# Patient Record
Sex: Male | Born: 1970 | Race: White | Hispanic: No | Smoking: Never smoker
Health system: Southern US, Community
[De-identification: ages and names within clinical notes are randomized; demographics above are authoritative.]

## PROBLEM LIST (undated history)

## (undated) DIAGNOSIS — I10 Essential (primary) hypertension: Secondary | ICD-10-CM

## (undated) HISTORY — PX: NO PAST SURGERIES: SHX2092

---

## 2019-11-18 ENCOUNTER — Encounter (HOSPITAL_BASED_OUTPATIENT_CLINIC_OR_DEPARTMENT_OTHER): Payer: Self-pay | Admitting: Emergency Medicine

## 2019-11-18 ENCOUNTER — Emergency Department (HOSPITAL_BASED_OUTPATIENT_CLINIC_OR_DEPARTMENT_OTHER): Payer: PRIVATE HEALTH INSURANCE

## 2019-11-18 ENCOUNTER — Inpatient Hospital Stay (HOSPITAL_BASED_OUTPATIENT_CLINIC_OR_DEPARTMENT_OTHER)
Admission: EM | Admit: 2019-11-18 | Discharge: 2019-11-22 | DRG: 291 | Disposition: A | Discharge status: Home / Self Care | Payer: PRIVATE HEALTH INSURANCE | Attending: Internal Medicine | Admitting: Internal Medicine

## 2019-11-18 ENCOUNTER — Other Ambulatory Visit: Payer: Self-pay

## 2019-11-18 DIAGNOSIS — Z79899 Other long term (current) drug therapy: Secondary | ICD-10-CM

## 2019-11-18 DIAGNOSIS — I878 Other specified disorders of veins: Secondary | ICD-10-CM | POA: Diagnosis not present

## 2019-11-18 DIAGNOSIS — I110 Hypertensive heart disease with heart failure: Secondary | ICD-10-CM | POA: Diagnosis not present

## 2019-11-18 DIAGNOSIS — R0602 Shortness of breath: Secondary | ICD-10-CM | POA: Diagnosis present

## 2019-11-18 DIAGNOSIS — Z7982 Long term (current) use of aspirin: Secondary | ICD-10-CM | POA: Diagnosis not present

## 2019-11-18 DIAGNOSIS — I161 Hypertensive emergency: Secondary | ICD-10-CM | POA: Diagnosis present

## 2019-11-18 DIAGNOSIS — Z6841 Body Mass Index (BMI) 40.0 and over, adult: Secondary | ICD-10-CM | POA: Diagnosis not present

## 2019-11-18 DIAGNOSIS — Z8616 Personal history of COVID-19: Secondary | ICD-10-CM

## 2019-11-18 DIAGNOSIS — I5031 Acute diastolic (congestive) heart failure: Secondary | ICD-10-CM | POA: Diagnosis present

## 2019-11-18 DIAGNOSIS — I509 Heart failure, unspecified: Secondary | ICD-10-CM

## 2019-11-18 DIAGNOSIS — I10 Essential (primary) hypertension: Secondary | ICD-10-CM

## 2019-11-18 DIAGNOSIS — I5033 Acute on chronic diastolic (congestive) heart failure: Secondary | ICD-10-CM | POA: Diagnosis present

## 2019-11-18 DIAGNOSIS — B356 Tinea cruris: Secondary | ICD-10-CM | POA: Diagnosis present

## 2019-11-18 HISTORY — DX: Essential (primary) hypertension: I10

## 2019-11-18 LAB — COMPREHENSIVE METABOLIC PANEL
ALT: 31 U/L (ref 0–44)
AST: 21 U/L (ref 15–41)
Albumin: 4.1 g/dL (ref 3.5–5.0)
Alkaline Phosphatase: 74 U/L (ref 38–126)
Anion gap: 11 (ref 5–15)
BUN: 19 mg/dL (ref 6–20)
CO2: 27 mmol/L (ref 22–32)
Calcium: 9.6 mg/dL (ref 8.9–10.3)
Chloride: 98 mmol/L (ref 98–111)
Creatinine, Ser: 1.06 mg/dL (ref 0.61–1.24)
GFR, Estimated: >60 mL/min (ref >60)
Glucose, Bld: 102 mg/dL — ABNORMAL HIGH (ref 70–99)
Potassium: 4.5 mmol/L (ref 3.5–5.1)
Sodium: 136 mmol/L (ref 135–145)
Total Bilirubin: 1 mg/dL (ref 0.3–1.2)
Total Protein: 7.9 g/dL (ref 6.5–8.1)

## 2019-11-18 LAB — CBC WITH DIFFERENTIAL/PLATELET
Abs Immature Granulocytes: 0.02 10*3/uL (ref 0.00–0.07)
Basophils Absolute: 0.1 10*3/uL (ref 0.0–0.1)
Basophils Relative: 1 %
Eosinophils Absolute: 0.3 10*3/uL (ref 0.0–0.5)
Eosinophils Relative: 3 %
HCT: 43.4 % (ref 39.0–52.0)
Hemoglobin: 14.3 g/dL (ref 13.0–17.0)
Immature Granulocytes: 0 %
Lymphocytes Relative: 20 %
Lymphs Abs: 1.6 10*3/uL (ref 0.7–4.0)
MCH: 28.5 pg (ref 26.0–34.0)
MCHC: 32.9 g/dL (ref 30.0–36.0)
MCV: 86.5 fL (ref 80.0–100.0)
Monocytes Absolute: 0.6 10*3/uL (ref 0.1–1.0)
Monocytes Relative: 8 %
Neutro Abs: 5.5 10*3/uL (ref 1.7–7.7)
Neutrophils Relative %: 68 %
Platelets: 252 10*3/uL (ref 150–400)
RBC: 5.02 MIL/uL (ref 4.22–5.81)
RDW: 14.4 % (ref 11.5–15.5)
WBC: 8.2 10*3/uL (ref 4.0–10.5)
nRBC: 0 % (ref 0.0–0.2)

## 2019-11-18 LAB — TROPONIN I (HIGH SENSITIVITY)
Troponin I (High Sensitivity): 34 ng/L — ABNORMAL HIGH (ref <18)
Troponin I (High Sensitivity): 31 ng/L — ABNORMAL HIGH (ref <18)

## 2019-11-18 LAB — RESPIRATORY PANEL BY RT PCR (FLU A&B, COVID)
Influenza A by PCR: NEGATIVE
Influenza B by PCR: NEGATIVE
SARS Coronavirus 2 by RT PCR: NEGATIVE

## 2019-11-18 LAB — BRAIN NATRIURETIC PEPTIDE: B Natriuretic Peptide: 88 pg/mL (ref 0.0–100.0)

## 2019-11-18 MED ORDER — FUROSEMIDE 10 MG/ML IJ SOLN
20.0000 mg | Freq: Once | INTRAMUSCULAR | Status: DC
  Filled 2019-11-18: qty 2

## 2019-11-18 MED ORDER — ASPIRIN 325 MG PO TABS
325.0000 mg | ORAL_TABLET | Freq: Every day | ORAL | Status: DC
  Administered 2019-11-19: 325 mg via ORAL
  Filled 2019-11-18: qty 1

## 2019-11-18 MED ORDER — HYDRALAZINE HCL 20 MG/ML IJ SOLN
10.0000 mg | Freq: Four times a day (QID) | INTRAMUSCULAR | Status: DC | PRN
  Administered 2019-11-18 – 2019-11-21 (×4): 10 mg via INTRAVENOUS
  Filled 2019-11-18 (×5): qty 1

## 2019-11-18 MED ORDER — FUROSEMIDE 10 MG/ML IJ SOLN
20.0000 mg | Freq: Once | INTRAMUSCULAR | Status: AC
  Administered 2019-11-18: 20 mg via INTRAVENOUS

## 2019-11-18 MED ORDER — ASPIRIN 81 MG PO CHEW
324.0000 mg | CHEWABLE_TABLET | Freq: Once | ORAL | Status: DC
  Filled 2019-11-18: qty 4

## 2019-11-18 MED ORDER — ACETAMINOPHEN 325 MG PO TABS
650.0000 mg | ORAL_TABLET | ORAL | Status: DC | PRN
  Administered 2019-11-19 – 2019-11-21 (×4): 650 mg via ORAL
  Filled 2019-11-18 (×4): qty 2

## 2019-11-18 MED ORDER — FUROSEMIDE 10 MG/ML IJ SOLN
20.0000 mg | Freq: Two times a day (BID) | INTRAMUSCULAR | Status: DC
  Administered 2019-11-19 – 2019-11-20 (×3): 20 mg via INTRAVENOUS
  Filled 2019-11-18 (×3): qty 2

## 2019-11-18 MED ORDER — ONDANSETRON HCL 4 MG/2ML IJ SOLN: 4.0000 mg | Freq: Four times a day (QID) | INTRAMUSCULAR | Status: DC | PRN

## 2019-11-18 MED ORDER — NITROGLYCERIN 0.4 MG SL SUBL
0.4000 mg | SUBLINGUAL_TABLET | SUBLINGUAL | Status: DC | PRN
  Administered 2019-11-18 (×2): 0.4 mg via SUBLINGUAL
  Filled 2019-11-18: qty 1

## 2019-11-18 NOTE — ED Triage Notes (Signed)
Reports left upper chest pain after playing around with a friend yesterday.  Reports he lifted her off the ground.  Afterwards had difficulty catching his breath.  Also noticed swelling in lower ext for the last six weeks.  Reports they started oozing a clear fluid a week ago.  Small sores noted.  Also reports he had covid at the end of august and self medicated with ivermectin.

## 2019-11-18 NOTE — ED Provider Notes (Signed)
[facility] EMERGENCY DEPARTMENT Provider Note   CSN: [id number] Arrival date & time: 11/18/19  1924     History Chief Complaint  Patient presents with  . Chest Pain  . Leg Swelling    Shawn Sanders is a 49 y.o. male.  HPI 49 year old male presents with multiple complaints, primarily shortness of breath and leg swelling.  He states the leg swelling has been on and off for a while but constant for about 2 weeks.  He also feels like his abdomen is swollen.  He has been having shortness of breath since he had Covid in August.  Most notices shortness of breath with minimal exertion.  However last night when he picked up a friend he noticed much more severe shortness of breath that took about an hour to go away.  He has been having some chest tightness/burning ever since last night.  He has not seen a doctor since 2015.  To his knowledge he does not have any medical problems. Has been waking up with orthopnea over the past several weeks.    Past Medical History:  Diagnosis Date  . Hypertension     Patient Active Problem List   Diagnosis Date Noted  . Acute congestive heart failure (HCC) 11/18/2019         No family history on file.  Social History   Tobacco Use  . Smoking status: Never Smoker  . Smokeless tobacco: Never Used  Substance Use Topics  . Alcohol use: Never  . Drug use: Never    Home Medications Prior to Admission medications   Not on File    Allergies    Patient has no allergy information on record.  Review of Systems   Review of Systems  Respiratory: Positive for shortness of breath.   Cardiovascular: Positive for chest pain and leg swelling.  Gastrointestinal: Positive for abdominal distention.  All other systems reviewed and are negative.   Physical Exam Updated Vital Signs BP (!) 208/135   Pulse 75   Temp 97.8 F (36.6 C) (Oral)   Resp 18   Ht 6\' 1"  (1.854 m)   Wt (!) 164.8 kg   SpO2 96%   BMI 47.93 kg/m   Physical  Exam Vitals and nursing note reviewed.  Constitutional:      Appearance: He is well-developed. He is obese.  HENT:     Head: Normocephalic and atraumatic.     Right Ear: External ear normal.     Left Ear: External ear normal.     Nose: Nose normal.  Eyes:     General:        Right eye: No discharge.        Left eye: No discharge.  Cardiovascular:     Rate and Rhythm: Normal rate and regular rhythm.     Heart sounds: Normal heart sounds.  Pulmonary:     Effort: Pulmonary effort is normal.     Breath sounds: Normal breath sounds. No decreased breath sounds, wheezing, rhonchi or rales.  Abdominal:     Palpations: Abdomen is soft.     Tenderness: There is no abdominal tenderness.  Musculoskeletal:     Cervical back: Neck supple.     Right lower leg: Edema present.     Left lower leg: Edema present.     Comments: Pitting edema bilaterally up to knees  Skin:    General: Skin is warm and dry.  Neurological:     Mental Status: He is alert.  Psychiatric:  Mood and Affect: Mood is not anxious.     ED Results / Procedures / Treatments   Labs (all labs ordered are listed, but only abnormal results are displayed) Labs Reviewed  COMPREHENSIVE METABOLIC PANEL - Abnormal; Notable for the following components:      Result Value   Glucose, Bld 102 (*)    All other components within normal limits  TROPONIN I (HIGH SENSITIVITY) - Abnormal; Notable for the following components:   Troponin I (High Sensitivity) 34 (*)    All other components within normal limits  TROPONIN I (HIGH SENSITIVITY) - Abnormal; Notable for the following components:   Troponin I (High Sensitivity) 31 (*)    All other components within normal limits  RESPIRATORY PANEL BY RT PCR (FLU A&B, COVID)  BRAIN NATRIURETIC PEPTIDE  CBC WITH DIFFERENTIAL/PLATELET  CBC WITH DIFFERENTIAL/PLATELET    EKG EKG Interpretation  Date/Time:  Sunday November 18 2019 19:29:00 EST Ventricular Rate:  91 PR  Interval:  138 QRS Duration: 98 QT Interval:  372 QTC Calculation: 457 R Axis:   31 Text Interpretation: Normal sinus rhythm no acute ST/T changes Confirmed by Pricilla Loveless [phone]) on 11/18/2019 7:50:15 PM   Radiology DG Chest 2 View  Result Date: 11/18/2019 CLINICAL DATA:  Chest pain EXAM: CHEST - 2 VIEW COMPARISON:  None. FINDINGS: The cardiomediastinal silhouette is enlarged. Both lungs are clear. The visualized skeletal structures are unremarkable. IMPRESSION: No active cardiopulmonary disease. Electronically Signed   By: Katherine Mantle M.D.   On: 11/18/2019 20:52    Procedures Procedures (including critical care time)  Medications Ordered in ED Medications  aspirin chewable tablet 324 mg (0 mg Oral Hold 11/18/19 2009)  nitroGLYCERIN (NITROSTAT) SL tablet 0.4 mg (0.4 mg Sublingual Given 11/18/19 2014)  hydrALAZINE (APRESOLINE) injection 10 mg (10 mg Intravenous Given 11/18/19 2235)  ondansetron (ZOFRAN) injection 4 mg (has no administration in time range)  acetaminophen (TYLENOL) tablet 650 mg (has no administration in time range)  furosemide (LASIX) injection 20 mg (has no administration in time range)  aspirin tablet 325 mg (has no administration in time range)  furosemide (LASIX) injection 20 mg (20 mg Intravenous Given 11/18/19 2250)    ED Course  I have reviewed the triage vital signs and the nursing notes.  Pertinent labs & imaging results that were available during my care of the patient were reviewed by me and considered in my medical decision making (see chart for details).    MDM Rules/Calculators/A&P                          My suspicion is the patient is having low level degree of CHF, likely from uncontrolled hypertension.  His blood pressure is much better with nitroglycerin and he symptomatically feels a lot better and says he has no further shortness of breath.  Troponin is elevated but then flat, indicating this probably is not ACS.  My suspicion for PE is  pretty low given the weight gain, edema, and orthopnea.  I think you will need better blood pressure control, echo, and diuresis.  Given no PCP this would be best done in the hospital.  Discussed with Dr. Leafy Half for admission. Final Clinical Impression(s) / ED Diagnoses Final diagnoses:  Acute congestive heart failure, unspecified heart failure type [facility])    Rx / DC Orders ED Discharge Orders    None       Pricilla Loveless, MD 11/18/19 2311

## 2019-11-18 NOTE — ED Notes (Signed)
Returned from XR 

## 2019-11-19 ENCOUNTER — Emergency Department (HOSPITAL_BASED_OUTPATIENT_CLINIC_OR_DEPARTMENT_OTHER): Payer: PRIVATE HEALTH INSURANCE

## 2019-11-19 ENCOUNTER — Encounter (HOSPITAL_COMMUNITY): Payer: Self-pay | Admitting: Internal Medicine

## 2019-11-19 DIAGNOSIS — B356 Tinea cruris: Secondary | ICD-10-CM

## 2019-11-19 DIAGNOSIS — I10 Essential (primary) hypertension: Secondary | ICD-10-CM

## 2019-11-19 DIAGNOSIS — I509 Heart failure, unspecified: Secondary | ICD-10-CM | POA: Diagnosis not present

## 2019-11-19 HISTORY — DX: Tinea cruris: B35.6

## 2019-11-19 HISTORY — DX: Essential (primary) hypertension: I10

## 2019-11-19 LAB — TROPONIN I (HIGH SENSITIVITY): Troponin I (High Sensitivity): 33 ng/L — ABNORMAL HIGH (ref <18)

## 2019-11-19 MED ORDER — FLUCONAZOLE 150 MG PO TABS
150.0000 mg | ORAL_TABLET | ORAL | Status: DC
  Administered 2019-11-19: 150 mg via ORAL
  Filled 2019-11-19: qty 1

## 2019-11-19 MED ORDER — ASPIRIN 325 MG PO TABS
325.0000 mg | ORAL_TABLET | Freq: Every day | ORAL | Status: DC
  Administered 2019-11-20 – 2019-11-22 (×3): 325 mg via ORAL
  Filled 2019-11-19 (×3): qty 1

## 2019-11-19 MED ORDER — ENOXAPARIN SODIUM 40 MG/0.4ML ~~LOC~~ SOLN
40.0000 mg | SUBCUTANEOUS | Status: DC
  Administered 2019-11-19: 40 mg via SUBCUTANEOUS
  Filled 2019-11-19: qty 0.4

## 2019-11-19 MED ORDER — HYDROCODONE-ACETAMINOPHEN 5-325 MG PO TABS
1.0000 | ORAL_TABLET | Freq: Once | ORAL | Status: AC
  Administered 2019-11-19: 1 via ORAL
  Filled 2019-11-19: qty 1

## 2019-11-19 MED ORDER — SODIUM CHLORIDE 0.9% FLUSH
3.0000 mL | Freq: Two times a day (BID) | INTRAVENOUS | Status: DC
  Administered 2019-11-19 – 2019-11-22 (×6): 3 mL via INTRAVENOUS

## 2019-11-19 NOTE — Plan of Care (Signed)
Pt verbalizes understanding of lasix and it's effect on fluid volume overload. Pt verbalizing frustration with headache associated with antihypertensives. BP improving.

## 2019-11-19 NOTE — ED Provider Notes (Signed)
6:38 AM  Patient holding for admission for bed for CHF and hypertension.  Arrived in the ED with blood pressures greater than 200 systolic.  Reports that he has mostly improved but has had persistent headache throughout the evening.  Headache is refractory to Tylenol and Norco.  States that the headache is weird and somewhat positional.  He remains persistently hypertensive.  Recent blood pressure 175/117.  Will obtain CT to rule out bleed given persistent hypertension.   Shon Baton, MD 11/19/19 [phone]

## 2019-11-19 NOTE — ED Notes (Signed)
Pt to CT

## 2019-11-19 NOTE — ED Notes (Signed)
Returned from CT.

## 2019-11-19 NOTE — ED Notes (Addendum)
Pt c/o "significant headache" still despite Tylenol, but states overall he still feels "much better". Denies any other sx.  Verbal orders given for Norco.

## 2019-11-19 NOTE — ED Notes (Signed)
Pt continues to c/o HA despite medications. He continues to deny any other sx. EDP notified. Orders received.

## 2019-11-19 NOTE — H&P (Signed)
History and Physical   Shawn Sanders AST:[id number] DOB: 16-Aug-1970 DOA: 11/18/2019  PCP: Patient, No Pcp Per   Patient coming from: Home via [facility]  Chief Complaint: Worsening edema and orthopnea  HPI: Shawn Sanders is a 49 y.o. male with medical history significant of hypertension who presents with gradual worsening edema with weight gain, which has begun to worsen more rapidly in the past week with the addition of orthopnea.  Patient has a history of high blood pressure and had tried some medicines about 15 years ago but he felt poorly after starting lisinopril possibly due to dropping his blood pressure too much, and had some numbness with Benicar.  He has not taken blood pressure medicine in the last 15 years or so.  He reports that he had Covid in August (was not tested, but his son was positive for Covid and he had symptoms at that time) and since that illness he has had continued dyspnea on exertion and has noticed progressive weight gain and worsening shortness of breath with need of sleeping in a recliner.  As above he has had gradual worsening of his edema as well and reports orthopnea.  He had an episode where he felt very significantly short of breath after picking up his girlfriend on Saturday night which would typically not make him short of breath.  He states that he feels much better after receiving blood pressure medication and a dose of Lasix in the ED.  He denies alcohol or tobacco use.    ED Course: Blood pressure in the ED was initially 170s to 200s systolic.  Vital signs otherwise stable.  Lab work showed normal CMP and CBC.  BNP was 88 (in the setting of obesity).  Troponins were flat at 34, 31, 33.  Respiratory panel was negative.  Chest x-ray showed no acute disease but did note in enlarged cardiomediastinal silhouette.  He did have a CT head done due to persistent headache which was normal.  EKG showed normal sinus rhythm.  Patient was treated with nitroglycerin,  hydralazine, Lasix with significant improvement in his symptoms.  Review of Systems: As per HPI otherwise all other systems reviewed and are negative.  Past Medical History:  Diagnosis Date  . Hypertension     Past Surgical History:  Procedure Laterality Date  . NO PAST SURGERIES      Social History  reports that he has never smoked. He has never used smokeless tobacco. He reports that he does not drink alcohol and does not use drugs.  Allergies  Allergen Reactions  . Penicillins Anaphylaxis  . Olive Oil     Blisters   . Other     Lobster: Swelling   . Raspberry     Swelling     Family History  Problem Relation Age of Onset  . Breast cancer Mother   . Hypertension Father   Reviewed on admission   Prior to Admission medications   Medication Sig Start Date End Date Taking? Authorizing Provider  acetaminophen (TYLENOL) 500 MG tablet Take 500 mg by mouth every 6 (six) hours as needed for mild pain.   Yes [provider]  aspirin 325 MG tablet Take 325 mg by mouth daily.   Yes [provider]    Physical Exam: Vitals:   11/19/19 0847 11/19/19 0950 11/19/19 1619 11/19/19 2034  BP:  (!) 205/116 (!) 171/141 (!) 204/121  Pulse:  80 71 76  Resp:  18 17 16   Temp: 98.5 F (  36.9 C) 97.8 F (36.6 C)  97.7 F (36.5 C)  TempSrc: Oral Oral  Oral  SpO2:  99% 97% 97%  Weight:  (!) 162.5 kg    Height:  6\' 1"  (1.854 m)     Physical Exam Constitutional:      General: He is not in acute distress.    Appearance: Normal appearance. He is obese.  HENT:     Head: Normocephalic and atraumatic.     Mouth/Throat:     Mouth: Mucous membranes are moist.     Pharynx: Oropharynx is clear.  Eyes:     Extraocular Movements: Extraocular movements intact.     Pupils: Pupils are equal, round, and reactive to light.  Cardiovascular:     Rate and Rhythm: Normal rate and regular rhythm.     Pulses: Normal pulses.     Heart sounds: Normal heart sounds.  Pulmonary:      Effort: Pulmonary effort is normal. No respiratory distress.     Breath sounds: Normal breath sounds.  Abdominal:     General: Bowel sounds are normal. There is no distension.     Palpations: Abdomen is soft.     Tenderness: There is no abdominal tenderness.  Musculoskeletal:        General: No swelling or deformity.     Right lower leg: Edema present.     Left lower leg: Edema present.  Skin:    General: Skin is warm and dry.     Comments: Venous stasis changes bilateral LEs  Neurological:     General: No focal deficit present.     Mental Status: Mental status is at baseline.    Labs on Admission: I have personally reviewed following labs and imaging studies  CBC: Recent Labs  Lab 11/18/19 2117  WBC 8.2  NEUTROABS 5.5  HGB 14.3  HCT 43.4  MCV 86.5  PLT 252    Basic Metabolic Panel: Recent Labs  Lab 11/18/19 2117  NA 136  K 4.5  CL 98  CO2 27  GLUCOSE 102*  BUN 19  CREATININE 1.06  CALCIUM 9.6    GFR: Estimated Creatinine Clearance: 133.1 mL/min (by C-G formula based on SCr of 1.06 mg/dL).  Liver Function Tests: Recent Labs  Lab 11/18/19 2117  AST 21  ALT 31  ALKPHOS 74  BILITOT 1.0  PROT 7.9  ALBUMIN 4.1    Urine analysis: No results found for: COLORURINE, APPEARANCEUR, LABSPEC, PHURINE, GLUCOSEU, HGBUR, BILIRUBINUR, KETONESUR, PROTEINUR, UROBILINOGEN, NITRITE, LEUKOCYTESUR  Radiological Exams on Admission: DG Chest 2 View  Result Date: 11/18/2019 CLINICAL DATA:  Chest pain EXAM: CHEST - 2 VIEW COMPARISON:  None. FINDINGS: The cardiomediastinal silhouette is enlarged. Both lungs are clear. The visualized skeletal structures are unremarkable. IMPRESSION: No active cardiopulmonary disease. Electronically Signed   By: 13/07/2019 M.D.   On: 11/18/2019 20:52   CT Head Wo Contrast  Result Date: 11/19/2019 CLINICAL DATA:  Hypertension and persistent headache. EXAM: CT HEAD WITHOUT CONTRAST TECHNIQUE: Contiguous axial images were obtained from  the base of the skull through the vertex without intravenous contrast. COMPARISON:  None. FINDINGS: Brain: The ventricles are normal in size and configuration. No extra-axial fluid collections are identified. The gray-white differentiation is maintained. No CT findings for acute hemispheric infarction or intracranial hemorrhage. No mass lesions. The brainstem and cerebellum are normal. Vascular: No hyperdense vessels or obvious aneurysm. Skull: No acute skull fracture. No bone lesion. Sinuses/Orbits: The paranasal sinuses and mastoid air cells are clear. The globes  are intact. Other: No scalp lesions, laceration or hematoma. IMPRESSION: Normal head CT. Electronically Signed   By: Rudie Meyer M.D.   On: 11/19/2019 06:54    EKG: Independently reviewed.  Normal sinus rhythm  Assessment/Plan Principal Problem:   Acute congestive heart failure (HCC) Active Problems:   HTN (hypertension)  Hypertension Acute heart failure versus hypertensive emergency > Patient with a known history of high blood pressure not on medication > Present with worsening orthopnea and edema in the setting of blood pressure in the 170s to 200s systolic > Features of acute heart failure due to longstanding hypertension versus hypertensive emergency > BNP was 88 but this in the setting of BMI of 47. > Responded to 20 mg of IV Lasix and antihypertensives with significant improvement in symptoms > Lungs are clear does have residual edema, but has venous stasis changes which may explain some of this - Echocardiogram - Continue IV Lasix - Daily weights, strict I/O - We will restart on lisinopril - Continue as needed hydralazine - Follow BMP  Tinea Curis > Increased itching for several months, previously responded to Diflucan - Diflucan p.o. 150 mg weekly for 4 weeks  Suspect Venous Stasis   DVT prophylaxis: Lovenox  Code Status:   Full  Family Communication:  Significant other updated at bedside  Disposition Plan:    Patient is from:  Home  Anticipated DC to:  Home  Anticipated DC date:  11/9 pending work-up  Anticipated DC barriers: None  Consults called:  None  Admission status:  Observation, telemetry  Severity of Illness: The appropriate patient status for this patient is OBSERVATION. Observation status is judged to be reasonable and necessary in order to provide the required intensity of service to ensure the patient's safety. The patient's presenting symptoms, physical exam findings, and initial radiographic and laboratory data in the context of their medical condition is felt to place them at decreased risk for further clinical deterioration. Furthermore, it is anticipated that the patient will be medically stable for discharge from the hospital within 2 midnights of admission. The following factors support the patient status of observation.   " The patient's presenting symptoms include edema, DOE, orthopnea. " The physical exam findings include lower extremity edema. " The initial radiographic and laboratory data are stable.  Synetta Fail MD [facility]  How to contact the [facility] Attending or Consulting provider 7A - 7P or covering provider during after hours 7P -7A, for this patient?   1. Check the care team in [facility] and look for a) attending/consulting [facility] provider listed and b) the [facility] team listed 2. Log into [email or web address] and use [facility]'s universal password to access. If you do not have the password, please contact the hospital operator. 3. Locate the [facility] provider you are looking for under [facility] and page to a number that you can be directly reached. 4. If you still have difficulty reaching the provider, please page the [facility] (Director on Call) for the Hospitalists listed on amion for assistance.  11/19/2019, 8:50 PM

## 2019-11-20 ENCOUNTER — Observation Stay (HOSPITAL_COMMUNITY): Payer: PRIVATE HEALTH INSURANCE

## 2019-11-20 DIAGNOSIS — I5031 Acute diastolic (congestive) heart failure: Secondary | ICD-10-CM | POA: Diagnosis not present

## 2019-11-20 DIAGNOSIS — I509 Heart failure, unspecified: Secondary | ICD-10-CM

## 2019-11-20 DIAGNOSIS — Z6841 Body Mass Index (BMI) 40.0 and over, adult: Secondary | ICD-10-CM | POA: Diagnosis not present

## 2019-11-20 DIAGNOSIS — I110 Hypertensive heart disease with heart failure: Secondary | ICD-10-CM | POA: Diagnosis present

## 2019-11-20 DIAGNOSIS — I10 Essential (primary) hypertension: Secondary | ICD-10-CM | POA: Diagnosis not present

## 2019-11-20 DIAGNOSIS — Z8616 Personal history of COVID-19: Secondary | ICD-10-CM | POA: Diagnosis not present

## 2019-11-20 DIAGNOSIS — I159 Secondary hypertension, unspecified: Secondary | ICD-10-CM

## 2019-11-20 DIAGNOSIS — I161 Hypertensive emergency: Secondary | ICD-10-CM

## 2019-11-20 DIAGNOSIS — Z79899 Other long term (current) drug therapy: Secondary | ICD-10-CM | POA: Diagnosis not present

## 2019-11-20 DIAGNOSIS — Z7982 Long term (current) use of aspirin: Secondary | ICD-10-CM | POA: Diagnosis not present

## 2019-11-20 DIAGNOSIS — B356 Tinea cruris: Secondary | ICD-10-CM

## 2019-11-20 DIAGNOSIS — I878 Other specified disorders of veins: Secondary | ICD-10-CM | POA: Diagnosis present

## 2019-11-20 DIAGNOSIS — R0602 Shortness of breath: Secondary | ICD-10-CM | POA: Diagnosis present

## 2019-11-20 DIAGNOSIS — I5033 Acute on chronic diastolic (congestive) heart failure: Secondary | ICD-10-CM | POA: Diagnosis present

## 2019-11-20 HISTORY — DX: Hypertensive emergency: I16.1

## 2019-11-20 HISTORY — DX: Heart failure, unspecified: I50.9

## 2019-11-20 LAB — ECHOCARDIOGRAM COMPLETE
AR max vel: 2.34 cm2
AV Area VTI: 2.48 cm2
AV Area mean vel: 2.07 cm2
AV Mean grad: 10 mmHg
AV Peak grad: 18 mmHg
Ao pk vel: 2.12 m/s
Area-P 1/2: 3.12 cm2
Calc EF: 66.4 %
Height: 73 in
S' Lateral: 2.9 cm
Single Plane A2C EF: 70.9 %
Single Plane A4C EF: 57 %
Weight: 5691.2 oz

## 2019-11-20 LAB — CBC
HCT: 46.2 % (ref 39.0–52.0)
Hemoglobin: 15.1 g/dL (ref 13.0–17.0)
MCH: 28.3 pg (ref 26.0–34.0)
MCHC: 32.7 g/dL (ref 30.0–36.0)
MCV: 86.7 fL (ref 80.0–100.0)
Platelets: 259 10*3/uL (ref 150–400)
RBC: 5.33 MIL/uL (ref 4.22–5.81)
RDW: 14.5 % (ref 11.5–15.5)
WBC: 7.7 10*3/uL (ref 4.0–10.5)
nRBC: 0 % (ref 0.0–0.2)

## 2019-11-20 LAB — BASIC METABOLIC PANEL
Anion gap: 10 (ref 5–15)
BUN: 16 mg/dL (ref 6–20)
CO2: 28 mmol/L (ref 22–32)
Calcium: 9.8 mg/dL (ref 8.9–10.3)
Chloride: 98 mmol/L (ref 98–111)
Creatinine, Ser: 1.18 mg/dL (ref 0.61–1.24)
GFR, Estimated: >60 mL/min (ref >60)
Glucose, Bld: 128 mg/dL — ABNORMAL HIGH (ref 70–99)
Potassium: 3.6 mmol/L (ref 3.5–5.1)
Sodium: 136 mmol/L (ref 135–145)

## 2019-11-20 LAB — HIV ANTIBODY (ROUTINE TESTING W REFLEX): HIV Screen 4th Generation wRfx: NONREACTIVE

## 2019-11-20 MED ORDER — LISINOPRIL 10 MG PO TABS
10.0000 mg | ORAL_TABLET | Freq: Every day | ORAL | Status: DC
  Filled 2019-11-20: qty 1

## 2019-11-20 MED ORDER — AMLODIPINE BESYLATE 10 MG PO TABS
10.0000 mg | ORAL_TABLET | Freq: Every day | ORAL | Status: DC
  Administered 2019-11-20 – 2019-11-22 (×3): 10 mg via ORAL
  Filled 2019-11-20 (×3): qty 1

## 2019-11-20 MED ORDER — FUROSEMIDE 10 MG/ML IJ SOLN
20.0000 mg | Freq: Four times a day (QID) | INTRAMUSCULAR | Status: AC
  Administered 2019-11-20 – 2019-11-21 (×4): 20 mg via INTRAVENOUS
  Filled 2019-11-20 (×4): qty 2

## 2019-11-20 MED ORDER — FUROSEMIDE 10 MG/ML IJ SOLN
20.0000 mg | Freq: Four times a day (QID) | INTRAVENOUS | Status: DC
  Filled 2019-11-20 (×2): qty 2

## 2019-11-20 MED ORDER — ENOXAPARIN SODIUM 80 MG/0.8ML ~~LOC~~ SOLN
80.0000 mg | SUBCUTANEOUS | Status: DC
  Administered 2019-11-20 – 2019-11-21 (×2): 80 mg via SUBCUTANEOUS
  Filled 2019-11-20 (×2): qty 0.8

## 2019-11-20 MED ORDER — PERFLUTREN LIPID MICROSPHERE
1.0000 mL | INTRAVENOUS | Status: AC | PRN
  Administered 2019-11-20: 2 mL via INTRAVENOUS
  Filled 2019-11-20: qty 10

## 2019-11-20 NOTE — Progress Notes (Signed)
PROGRESS NOTE    Shawn Sanders  XBL:[id number] DOB: Apr 05, 1970 DOA: 11/18/2019 PCP: Patient, No Pcp Per   Brief Narrative:  49 year old male with history of obesity who has not seen a medical provider and a number of years who presents to [facility] emergency department with several day history of progressively worsening peripheral edema, pillow orthopnea paroxysmal nocturnal dyspnea.  Patient also complaining of vague atypical chest tightness.  Patient presenting with hypertensive urgency and blood pressure of 233/134.  Emergency department provider felt the patient was suffering from acute congestive heart failure despite normal BNP and therefore provided patient with 20 mg of IV Lasix.  Blood pressure improving status post Lasix administration.  ER provider requesting admission to [facility] bed for titration of antihypertensives, IV diuretics, echocardiogram and evaluation of atypical chest discomfort.  Patient placed in observation at [facility] telemetry bed.    Assessment & Plan:   Principal Problem:   Acute congestive heart failure (HCC) Active Problems:   HTN (hypertension)   Tinea cruris  Hypertensive emergency, POA Rule out new onset heart failure -Patient presents with diffuse edema, atypical chest pain with systolic blood pressure greater than 210 -Patient reports intolerances to lisinopril, in the form of dizziness -Initiate patient on amlodipine, continue Lasix at increased dose x4 doses to improve volume overload -Echo pending, rule out newly diagnosed heart failure, lengthy discussion at bedside about need for medication and lifestyle compliance as well as for close follow-up in the outpatient setting as he does not currently have a PCP or cardiologist  Tinea Curis -Previously diagnosed in the outpatient setting was given as needed Diflucan at some point in the recent past with moderate improvement in symptoms -Continue Diflucan p.o. 150 mg weekly for 4  weeks  DVT prophylaxis: Lovenox Code Status: Full Family Communication: None present  Status is: Inpatient  Dispo: The patient is from: Home              Anticipated d/c is to: Home              Anticipated d/c date is: 24 to 48 hours pending clinical course              Patient currently not medically stable for discharge  Consultants:   None  Procedures:   None  Antimicrobials:  Diflucan  Subjective: No acute issues or events overnight, quite anxious about his elevated blood pressure given his family history of heart failure and is concerned about his own increased mortality.  Denies nausea, vomiting, diarrhea, constipation, headache, fevers, chills, chest pain, shortness of breath.  He does report ongoing lower extremity edema but appears to be resolving from previous  Objective: Vitals:   11/20/19 0000 11/20/19 0436 11/20/19 1009 11/20/19 1222  BP: (!) 163/92 (!) 174/108 (!) 190/108 (!) 186/102  Pulse:  76  80  Resp:  16  18  Temp:  98.1 F (36.7 C)  98.6 F (37 C)  TempSrc:  Oral  Oral  SpO2:    93%  Weight:  (!) 161.3 kg    Height:        Intake/Output Summary (Last 24 hours) at 11/20/2019 1355 Last data filed at 11/20/2019 0848 Gross per 24 hour  Intake 600 ml  Output 1750 ml  Net -1150 ml   Filed Weights   11/18/19 1940 11/19/19 0950 11/20/19 0436  Weight: (!) 164.8 kg (!) 162.5 kg (!) 161.3 kg    Examination:  General:  Pleasantly resting in bed, No  acute distress. HEENT:  Normocephalic atraumatic.  Sclerae nonicteric, noninjected.  Extraocular movements intact bilaterally. Neck:  Without mass or deformity.  Trachea is midline. Lungs:  Clear to auscultate bilaterally without rhonchi, wheeze, or rales. Heart:  Regular rate and rhythm.  Without murmurs, rubs, or gallops. Abdomen:  Soft, nontender, nondistended.  Without guarding or rebound. Extremities: Without cyanosis, clubbing, 2+ BLE edema, or obvious deformity. Vascular:  Dorsalis pedis and  posterior tibial pulses palpable bilaterally. Skin:  Warm and dry, no erythema, no ulcerations.   Data Reviewed: I have personally reviewed following labs and imaging studies  CBC: Recent Labs  Lab 11/18/19 2117 11/20/19 0534  WBC 8.2 7.7  NEUTROABS 5.5  --   HGB 14.3 15.1  HCT 43.4 46.2  MCV 86.5 86.7  PLT 252 259   Basic Metabolic Panel: Recent Labs  Lab 11/18/19 2117 11/20/19 0534  NA 136 136  K 4.5 3.6  CL 98 98  CO2 27 28  GLUCOSE 102* 128*  BUN 19 16  CREATININE 1.06 1.18  CALCIUM 9.6 9.8   GFR: Estimated Creatinine Clearance: 119.2 mL/min (by C-G formula based on SCr of 1.18 mg/dL). Liver Function Tests: Recent Labs  Lab 11/18/19 2117  AST 21  ALT 31  ALKPHOS 74  BILITOT 1.0  PROT 7.9  ALBUMIN 4.1   No results for input(s): LIPASE, AMYLASE in the last 168 hours. No results for input(s): AMMONIA in the last 168 hours. Coagulation Profile: No results for input(s): INR, PROTIME in the last 168 hours. Cardiac Enzymes: No results for input(s): CKTOTAL, CKMB, CKMBINDEX, TROPONINI in the last 168 hours. BNP (last 3 results) No results for input(s): PROBNP in the last 8760 hours. HbA1C: No results for input(s): HGBA1C in the last 72 hours. CBG: No results for input(s): GLUCAP in the last 168 hours. Lipid Profile: No results for input(s): CHOL, HDL, LDLCALC, TRIG, CHOLHDL, LDLDIRECT in the last 72 hours. Thyroid Function Tests: No results for input(s): TSH, T4TOTAL, FREET4, T3FREE, THYROIDAB in the last 72 hours. Anemia Panel: No results for input(s): VITAMINB12, FOLATE, FERRITIN, TIBC, IRON, RETICCTPCT in the last 72 hours. Sepsis Labs: No results for input(s): PROCALCITON, LATICACIDVEN in the last 168 hours.  Recent Results (from the past 240 hour(s))  Respiratory Panel by RT PCR (Flu A&B, Covid) - Nasopharyngeal Swab     Status: None   Collection Time: 11/18/19 10:12 PM   Specimen: Nasopharyngeal Swab  Result Value Ref Range Status   SARS  Coronavirus 2 by RT PCR NEGATIVE NEGATIVE Final    Comment: (NOTE) SARS-CoV-2 target nucleic acids are NOT DETECTED.  The SARS-CoV-2 RNA is generally detectable in upper respiratoy specimens during the acute phase of infection. The lowest concentration of SARS-CoV-2 viral copies this assay can detect is 131 copies/mL. A negative result does not preclude SARS-Cov-2 infection and should not be used as the sole basis for treatment or other patient management decisions. A negative result may occur with  improper specimen collection/handling, submission of specimen other than nasopharyngeal swab, presence of viral mutation(s) within the areas targeted by this assay, and inadequate number of viral copies (<131 copies/mL). A negative result must be combined with clinical observations, patient history, and epidemiological information. The expected result is Negative.  Fact Sheet for Patients:  https://www.moore.com/https://www.fda.gov/media/142436/download  Fact Sheet for Healthcare Providers:  https://www.young.biz/https://www.fda.gov/media/142435/download  This test is no t yet approved or cleared by the [place] FDA and  has been authorized for detection and/or diagnosis of SARS-CoV-2 by FDA under an  Emergency Use Authorization (EUA). This EUA will remain  in effect (meaning this test can be used) for the duration of the COVID-19 declaration under Section 564(b)(1) of the Act, 21 U.S.C. section 360bbb-3(b)(1), unless the authorization is terminated or revoked sooner.     Influenza A by PCR NEGATIVE NEGATIVE Final   Influenza B by PCR NEGATIVE NEGATIVE Final    Comment: (NOTE) The Xpert Xpress SARS-CoV-2/FLU/RSV assay is intended as an aid in  the diagnosis of influenza from Nasopharyngeal swab specimens and  should not be used as a sole basis for treatment. Nasal washings and  aspirates are unacceptable for Xpert Xpress SARS-CoV-2/FLU/RSV  testing.  Fact Sheet for  Patients: https://www.moore.com/  Fact Sheet for Healthcare Providers: https://www.young.biz/  This test is not yet approved or cleared by the [place] FDA and  has been authorized for detection and/or diagnosis of SARS-CoV-2 by  FDA under an Emergency Use Authorization (EUA). This EUA will remain  in effect (meaning this test can be used) for the duration of the  Covid-19 declaration under Section 564(b)(1) of the Act, 21  U.S.C. section 360bbb-3(b)(1), unless the authorization is  terminated or revoked. Performed at [facility], [address] [place], [place] [address]          Radiology Studies: DG Chest 2 View  Result Date: 11/18/2019 CLINICAL DATA:  Chest pain EXAM: CHEST - 2 VIEW COMPARISON:  None. FINDINGS: The cardiomediastinal silhouette is enlarged. Both lungs are clear. The visualized skeletal structures are unremarkable. IMPRESSION: No active cardiopulmonary disease. Electronically Signed   By: Katherine Mantle M.D.   On: 11/18/2019 20:52   CT Head Wo Contrast  Result Date: 11/19/2019 CLINICAL DATA:  Hypertension and persistent headache. EXAM: CT HEAD WITHOUT CONTRAST TECHNIQUE: Contiguous axial images were obtained from the base of the skull through the vertex without intravenous contrast. COMPARISON:  None. FINDINGS: Brain: The ventricles are normal in size and configuration. No extra-axial fluid collections are identified. The gray-white differentiation is maintained. No CT findings for acute hemispheric infarction or intracranial hemorrhage. No mass lesions. The brainstem and cerebellum are normal. Vascular: No hyperdense vessels or obvious aneurysm. Skull: No acute skull fracture. No bone lesion. Sinuses/Orbits: The paranasal sinuses and mastoid air cells are clear. The globes are intact. Other: No scalp lesions, laceration or hematoma. IMPRESSION: Normal head CT. Electronically Signed   By: Rudie Meyer M.D.    On: 11/19/2019 06:54   ECHOCARDIOGRAM COMPLETE  Result Date: 11/20/2019    ECHOCARDIOGRAM REPORT   Patient Name:   Shawn Sanders Date of Exam: 11/20/2019 Medical Rec #:  [id number]    Height:       73.0 in Accession #:    [id number]   Weight:       355.7 lb Date of Birth:  27-Jul-1970   BSA:          2.748 m Patient Age:    49 years     BP:           174/108 mmHg Patient Gender: M            HR:           90 bpm. Exam Location:  Inpatient Procedure: 2D Echo, Cardiac Doppler, Color Doppler and Intracardiac            Opacification Agent Indications:    R07.9* Chest pain, unspecified; I50.9* Heart failure                 (  unspecified)  History:        Patient has no prior history of Echocardiogram examinations.                 Signs/Symptoms:Chest Pain; Risk Factors:Hypertension.  Sonographer:    Sheralyn Boatman RDCS Referring Phys: [id number] Cecille Po [facility]  Sonographer Comments: Technically challenging study due to limited acoustic windows, Technically difficult study due to poor echo windows, suboptimal parasternal window, suboptimal apical window, suboptimal subcostal window and patient is morbidly obese.  Image acquisition challenging due to patient body habitus. IMPRESSIONS  1. Left ventricular ejection fraction, by estimation, is 65 to 70%. The left ventricle has hyperdynamic function. The left ventricle has no regional wall motion abnormalities. There is moderate concentric left ventricular hypertrophy. Left ventricular diastolic parameters are consistent with Grade II diastolic dysfunction (pseudonormalization). Elevated left atrial pressure.  2. Right ventricular systolic function is normal. The right ventricular size is normal. Tricuspid regurgitation signal is inadequate for assessing PA pressure.  3. The mitral valve was not well visualized. No evidence of mitral valve regurgitation. No evidence of mitral stenosis.  4. The aortic valve was not well visualized. Aortic valve regurgitation is not visualized. No  aortic stenosis is present. FINDINGS  Left Ventricle: Left ventricular ejection fraction, by estimation, is 65 to 70%. The left ventricle has hyperdynamic function. The left ventricle has no regional wall motion abnormalities. Definity contrast agent was given IV to delineate the left ventricular endocardial borders. The left ventricular internal cavity size was normal in size. There is moderate concentric left ventricular hypertrophy. Left ventricular diastolic parameters are consistent with Grade II diastolic dysfunction (pseudonormalization). Elevated left atrial pressure. Right Ventricle: The right ventricular size is normal. No increase in right ventricular wall thickness. Right ventricular systolic function is normal. Tricuspid regurgitation signal is inadequate for assessing PA pressure. Left Atrium: Left atrial size was not well visualized. Right Atrium: Right atrial size was not well visualized. Pericardium: There is no evidence of pericardial effusion. Mitral Valve: The mitral valve was not well visualized. No evidence of mitral valve regurgitation. No evidence of mitral valve stenosis. Tricuspid Valve: The tricuspid valve is not well visualized. Tricuspid valve regurgitation is not demonstrated. Aortic Valve: Borderline high aortic valve gradients are related to high cardiac output. The aortic valve was not well visualized. Aortic valve regurgitation is not visualized. No aortic stenosis is present. Aortic valve mean gradient measures 10.0 mmHg.  Aortic valve peak gradient measures 18.0 mmHg. Aortic valve area, by VTI measures 2.48 cm. Pulmonic Valve: The pulmonic valve was not well visualized. Pulmonic valve regurgitation is not visualized. Aorta: The aortic root is normal in size and structure. Venous: The inferior vena cava was not well visualized. IAS/Shunts: The interatrial septum was not well visualized.  LEFT VENTRICLE PLAX 2D LVIDd:         4.30 cm      Diastology LVIDs:         2.90 cm      LV  e' medial:    6.31 cm/s LV PW:         1.70 cm      LV E/e' medial:  21.1 LV IVS:        1.50 cm      LV e' lateral:   6.09 cm/s LVOT diam:     2.00 cm      LV E/e' lateral: 21.8 LV SV:         91 LV SV Index:   33 LVOT Area:  3.14 cm  LV Volumes (MOD) LV vol d, MOD A2C: 117.0 ml LV vol d, MOD A4C: 124.0 ml LV vol s, MOD A2C: 34.1 ml LV vol s, MOD A4C: 53.3 ml LV SV MOD A2C:     82.9 ml LV SV MOD A4C:     124.0 ml LV SV MOD BP:      83.8 ml RIGHT VENTRICLE RV S prime:     15.90 cm/s TAPSE (M-mode): 3.2 cm LEFT ATRIUM             Index       RIGHT ATRIUM           Index LA diam:        3.50 cm 1.27 cm/m  RA Area:     19.00 cm LA Vol (A2C):   25.4 ml 9.24 ml/m  RA Volume:   50.80 ml  18.49 ml/m LA Vol (A4C):   38.7 ml 14.08 ml/m LA Biplane Vol: 33.3 ml 12.12 ml/m  AORTIC VALVE AV Area (Vmax):    2.34 cm AV Area (Vmean):   2.07 cm AV Area (VTI):     2.48 cm AV Vmax:           212.00 cm/s AV Vmean:          150.000 cm/s AV VTI:            0.366 m AV Peak Grad:      18.0 mmHg AV Mean Grad:      10.0 mmHg LVOT Vmax:         158.00 cm/s LVOT Vmean:        98.900 cm/s LVOT VTI:          0.289 m LVOT/AV VTI ratio: 0.79  AORTA Ao Root diam: 3.00 cm MITRAL VALVE MV Area (PHT): 3.12 cm     SHUNTS MV Decel Time: 243 msec     Systemic VTI:  0.29 m MV E velocity: 133.00 cm/s  Systemic Diam: 2.00 cm MV A velocity: 99.10 cm/s MV E/A ratio:  1.34 Mihai Croitoru MD Electronically signed by Thurmon Fair MD Signature Date/Time: 11/20/2019/10:25:00 AM    Final     Scheduled Meds: . amLODipine  10 mg Oral Daily  . aspirin  325 mg Oral Daily  . enoxaparin (LOVENOX) injection  80 mg Subcutaneous Q24H  . fluconazole  150 mg Oral Weekly  . furosemide  20 mg Intravenous Q6H  . sodium chloride flush  3 mL Intravenous Q12H    LOS: 0 days   Time spent:  Azucena Fallen, DO [facility]  If 7PM-7AM, please contact night-coverage [email or web address]  11/20/2019, 1:55 PM

## 2019-11-20 NOTE — Progress Notes (Signed)
  Echocardiogram 2D Echocardiogram has been performed.  Shawn Sanders 11/20/2019, 9:15 AM

## 2019-11-21 DIAGNOSIS — I5031 Acute diastolic (congestive) heart failure: Secondary | ICD-10-CM

## 2019-11-21 DIAGNOSIS — I10 Essential (primary) hypertension: Secondary | ICD-10-CM

## 2019-11-21 DIAGNOSIS — E66813 Obesity, class 3: Secondary | ICD-10-CM

## 2019-11-21 HISTORY — DX: Morbid (severe) obesity due to excess calories: E66.01

## 2019-11-21 HISTORY — DX: Obesity, class 3: E66.813

## 2019-11-21 HISTORY — DX: Acute diastolic (congestive) heart failure: I50.31

## 2019-11-21 LAB — BASIC METABOLIC PANEL
Anion gap: 13 (ref 5–15)
BUN: 21 mg/dL — ABNORMAL HIGH (ref 6–20)
CO2: 25 mmol/L (ref 22–32)
Calcium: 9.7 mg/dL (ref 8.9–10.3)
Chloride: 99 mmol/L (ref 98–111)
Creatinine, Ser: 1.28 mg/dL — ABNORMAL HIGH (ref 0.61–1.24)
GFR, Estimated: >60 mL/min (ref >60)
Glucose, Bld: 95 mg/dL (ref 70–99)
Potassium: 4.7 mmol/L (ref 3.5–5.1)
Sodium: 137 mmol/L (ref 135–145)

## 2019-11-21 MED ORDER — FUROSEMIDE 40 MG PO TABS
40.0000 mg | ORAL_TABLET | Freq: Every day | ORAL | Status: DC
  Administered 2019-11-21 – 2019-11-22 (×2): 40 mg via ORAL
  Filled 2019-11-21 (×2): qty 1

## 2019-11-21 MED ORDER — HYDRALAZINE HCL 25 MG PO TABS
25.0000 mg | ORAL_TABLET | Freq: Three times a day (TID) | ORAL | Status: DC
  Administered 2019-11-21 – 2019-11-22 (×2): 25 mg via ORAL
  Filled 2019-11-21 (×2): qty 1

## 2019-11-21 NOTE — Plan of Care (Signed)
Pts blood pressure and vitals continuing to improve. Pt understanding of current health condition.

## 2019-11-21 NOTE — Progress Notes (Addendum)
[facility]  PROGRESS NOTE  Shawn Sanders WUJ:[id number] DOB: 12-Apr-1970 DOA: 11/18/2019 PCP: Patient, No Pcp Per Admit date - 11/18/2019   Admitting Physician Marinda Elk, MD  Outpatient Primary MD for the patient is Patient, No Pcp Per  LOS - 1 Brief Narrative   Shawn Sanders is a 49 year old male with medical history significant for hypertension not currently on any medications and obesity class III who presented with several weeks of worsening lower leg swelling, orthopnea, PND, episode of atypical chest pain with exertion and was admitted for diagnosis of hypertensive emergency.  On presentation blood pressure 233/134, slightly elevated troponin of 34 quickly down to 231, unremarkable BNP and CT head negative no findings on chest x-ray.  Given shortness of breath patient was started on IV Lasix in the ED with noted improvement of symptoms.  Hospital course patient was found to have grade 2 diastolic dysfunction preserved EF on TTE.  Patient was started on amlodipine and continued on scheduled IV Lasix with continued improvement in respiratory symptoms as well as fluid overload on exam.  Currently having some difficulty controlling patient's blood pressure with last measure of 184/106.  Subjective  Today denies chest pain, no shortness of breath.  Reports significantly improved respiratory status.  As well as swelling.  A & P    Hypertensive emergency, present on arrival, emergency resolved, still has quite elevated blood pressure not at goal.  Blood pressure significantly improved from presentation though still not at goal 184/106.  Denies any chest pain, reports significant improvement in shortness of breath, exam also notably less fluid overloaded -We will discontinue IV Lasix and switch to oral Lasix, continue amlodipine 10 mg-closely monitor BP -Blood pressure remained stable, and continued improvement in fluid overload expect discharge next 24 hours -Also suggested a outpatient  sleep study ( he reports daytime somnolence and snoring) to rule out OSA possible further contributor to poorly controlled hypertension  Addendum: Blood pressure remained elevated with systolic in the 180s and diastolic over 100 despite IV hydralazine.  Will start low-dose hydralazine at 25 mg 3 times daily and closely monitor BP  Diastolic CHF, new diagnosis, likely presented with acute exacerbation in setting of hypertensive emergency,   Improving.  TTE confirms preserved EF with grade 2 diastolic dysfunction.-[facility] reports of blood pressure management continue blood pressure medications on discharge and follow-up with PCP -Monitor fluid status transition to oral Lasix -Monitor daily weights, ins and outs  Obesity, class III BMI greater than 46.  Patient states he has had significant increase in his weight since being diagnosed with Covid in August of this year. -Lifestyle changes recommended -Need close follow-up with PCP     Family Communication  : None  Code Status : Full  Disposition Plan  :  Patient is from home. Anticipated d/c date:  1 day. Barriers to d/c or necessity for inpatient status:  Expect discharge in next 24 hours if blood pressure/volume status continues to improve remains stable on transition to complete oral regimen Consults  : None  Procedures  : TTE  DVT Prophylaxis  :  Lovenox  MDM: The below labs and imaging reports were reviewed and summarized above.  Medication management as above.  Lab Results  Component Value Date   PLT 259 11/20/2019    Diet :  Diet Order            Diet Heart Room service appropriate? Yes; Fluid consistency: Thin  Diet effective now  Inpatient Medications Scheduled Meds: . amLODipine  10 mg Oral Daily  . aspirin  325 mg Oral Daily  . enoxaparin (LOVENOX) injection  80 mg Subcutaneous Q24H  . fluconazole  150 mg Oral Weekly  . furosemide  40 mg Oral Daily  . sodium chloride flush  3 mL  Intravenous Q12H   Continuous Infusions: PRN Meds:.acetaminophen, hydrALAZINE, nitroGLYCERIN, ondansetron (ZOFRAN) IV  Antibiotics  :   Anti-infectives (From admission, onward)   Start     Dose/Rate Route Frequency Ordered Stop   11/19/19 2145  fluconazole (DIFLUCAN) tablet 150 mg        150 mg Oral Weekly 11/19/19 2053 12/17/19 2144       Objective   Vitals:   11/20/19 2342 11/21/19 0345 11/21/19 1050 11/21/19 1208  BP:  (!) 157/97 (!) 180/89 (!) 184/106  Pulse:  90  81  Resp:    20  Temp: 98.7 F (37.1 C) 98.3 F (36.8 C)  98.5 F (36.9 C)  TempSrc: Oral Oral  Oral  SpO2:    98%  Weight:  (!) 160.9 kg    Height:        SpO2: 98 %  Wt Readings from Last 3 Encounters:  11/21/19 (!) 160.9 kg     Intake/Output Summary (Last 24 hours) at 11/21/2019 1602 Last data filed at 11/21/2019 1400 Gross per 24 hour  Intake --  Output 2350 ml  Net -2350 ml    Physical Exam:     Awake Alert, Oriented X 3, Normal affect No new F.N deficits,  [place].AT, Normal respiratory effort on room air, CTAB RRR,No Gallops,Rubs or new Murmurs, 1+ pitting edema bilateral lower extremities +ve B.Sounds, Abd Soft, No tenderness, No rebound, guarding or rigidity. No Cyanosis, No new Rash or bruise     I have personally reviewed the following:   Data Reviewed:  CBC Recent Labs  Lab 11/18/19 2117 11/20/19 0534  WBC 8.2 7.7  HGB 14.3 15.1  HCT 43.4 46.2  PLT 252 259  MCV 86.5 86.7  MCH 28.5 28.3  MCHC 32.9 32.7  RDW 14.4 14.5  LYMPHSABS 1.6  --   MONOABS 0.6  --   EOSABS 0.3  --   BASOSABS 0.1  --     Chemistries  Recent Labs  Lab 11/18/19 2117 11/20/19 0534 11/21/19 0405  NA 136 136 137  K 4.5 3.6 4.7  CL 98 98 99  CO2 27 28 25   GLUCOSE 102* 128* 95  BUN 19 16 21*  CREATININE 1.06 1.18 1.28*  CALCIUM 9.6 9.8 9.7  AST 21  --   --   ALT 31  --   --   ALKPHOS 74  --   --   BILITOT 1.0  --   --     ------------------------------------------------------------------------------------------------------------------ No results for input(s): CHOL, HDL, LDLCALC, TRIG, CHOLHDL, LDLDIRECT in the last 72 hours.  No results found for: HGBA1C ------------------------------------------------------------------------------------------------------------------ No results for input(s): TSH, T4TOTAL, T3FREE, THYROIDAB in the last 72 hours.  Invalid input(s): FREET3 ------------------------------------------------------------------------------------------------------------------ No results for input(s): VITAMINB12, FOLATE, FERRITIN, TIBC, IRON, RETICCTPCT in the last 72 hours.  Coagulation profile No results for input(s): INR, PROTIME in the last 168 hours.  No results for input(s): DDIMER in the last 72 hours.  Cardiac Enzymes No results for input(s): CKMB, TROPONINI, MYOGLOBIN in the last 168 hours.  Invalid input(s): CK ------------------------------------------------------------------------------------------------------------------    Component Value Date/Time   BNP 88.0 11/18/2019 2117    Micro Results Recent Results (from  the past 240 hour(s))  Respiratory Panel by RT PCR (Flu A&B, Covid) - Nasopharyngeal Swab     Status: None   Collection Time: 11/18/19 10:12 PM   Specimen: Nasopharyngeal Swab  Result Value Ref Range Status   SARS Coronavirus 2 by RT PCR NEGATIVE NEGATIVE Final    Comment: (NOTE) SARS-CoV-2 target nucleic acids are NOT DETECTED.  The SARS-CoV-2 RNA is generally detectable in upper respiratoy specimens during the acute phase of infection. The lowest concentration of SARS-CoV-2 viral copies this assay can detect is 131 copies/mL. A negative result does not preclude SARS-Cov-2 infection and should not be used as the sole basis for treatment or other patient management decisions. A negative result may occur with  improper specimen collection/handling, submission of  specimen other than nasopharyngeal swab, presence of viral mutation(s) within the areas targeted by this assay, and inadequate number of viral copies (<131 copies/mL). A negative result must be combined with clinical observations, patient history, and epidemiological information. The expected result is Negative.  Fact Sheet for Patients:  https://www.moore.com/https://www.fda.gov/media/142436/download  Fact Sheet for Healthcare Providers:  https://www.young.biz/https://www.fda.gov/media/142435/download  This test is no t yet approved or cleared by the [place] FDA and  has been authorized for detection and/or diagnosis of SARS-CoV-2 by FDA under an Emergency Use Authorization (EUA). This EUA will remain  in effect (meaning this test can be used) for the duration of the COVID-19 declaration under Section 564(b)(1) of the Act, 21 U.S.C. section 360bbb-3(b)(1), unless the authorization is terminated or revoked sooner.     Influenza A by PCR NEGATIVE NEGATIVE Final   Influenza B by PCR NEGATIVE NEGATIVE Final    Comment: (NOTE) The Xpert Xpress SARS-CoV-2/FLU/RSV assay is intended as an aid in  the diagnosis of influenza from Nasopharyngeal swab specimens and  should not be used as a sole basis for treatment. Nasal washings and  aspirates are unacceptable for Xpert Xpress SARS-CoV-2/FLU/RSV  testing.  Fact Sheet for Patients: https://www.moore.com/https://www.fda.gov/media/142436/download  Fact Sheet for Healthcare Providers: https://www.young.biz/https://www.fda.gov/media/142435/download  This test is not yet approved or cleared by the [place] FDA and  has been authorized for detection and/or diagnosis of SARS-CoV-2 by  FDA under an Emergency Use Authorization (EUA). This EUA will remain  in effect (meaning this test can be used) for the duration of the  Covid-19 declaration under Section 564(b)(1) of the Act, 21  U.S.C. section 360bbb-3(b)(1), unless the authorization is  terminated or revoked. Performed at [facility] [place], [address] [place], [place] [address]     Radiology Reports DG Chest 2 View  Result Date: 11/18/2019 CLINICAL DATA:  Chest pain EXAM: CHEST - 2 VIEW COMPARISON:  None. FINDINGS: The cardiomediastinal silhouette is enlarged. Both lungs are clear. The visualized skeletal structures are unremarkable. IMPRESSION: No active cardiopulmonary disease. Electronically Signed   By: Katherine Mantlehristopher  Green M.D.   On: 11/18/2019 20:52   CT Head Wo Contrast  Result Date: 11/19/2019 CLINICAL DATA:  Hypertension and persistent headache. EXAM: CT HEAD WITHOUT CONTRAST TECHNIQUE: Contiguous axial images were obtained from the base of the skull through the vertex without intravenous contrast. COMPARISON:  None. FINDINGS: Brain: The ventricles are normal in size and configuration. No extra-axial fluid collections are identified. The gray-white differentiation is maintained. No CT findings for acute hemispheric infarction or intracranial hemorrhage. No mass lesions. The brainstem and cerebellum are normal. Vascular: No hyperdense vessels or obvious aneurysm. Skull: No acute skull fracture. No bone lesion. Sinuses/Orbits: The paranasal sinuses and mastoid air  cells are clear. The globes are intact. Other: No scalp lesions, laceration or hematoma. IMPRESSION: Normal head CT. Electronically Signed   By: Rudie Meyer M.D.   On: 11/19/2019 06:54   ECHOCARDIOGRAM COMPLETE  Result Date: 11/20/2019    ECHOCARDIOGRAM REPORT   Patient Name:   Shawn Sanders Date of Exam: 11/20/2019 Medical Rec #:  [id number]    Height:       73.0 in Accession #:    [id number]   Weight:       355.7 lb Date of Birth:  [birth year]   BSA:          2.748 m Patient Age:    49 years     BP:           174/108 mmHg Patient Gender: M            HR:           90 bpm. Exam Location:  Inpatient Procedure: 2D Echo, Cardiac Doppler, Color Doppler and Intracardiac            Opacification Agent Indications:    R07.9* Chest pain, unspecified; I50.9* Heart failure                  (unspecified)  History:        Patient has no prior history of Echocardiogram examinations.                 Signs/Symptoms:Chest Pain; Risk Factors:Hypertension.  Sonographer:    Sheralyn Boatman RDCS Referring Phys: [id number] Cecille Po [facility]  Sonographer Comments: Technically challenging study due to limited acoustic windows, Technically difficult study due to poor echo windows, suboptimal parasternal window, suboptimal apical window, suboptimal subcostal window and patient is morbidly obese.  Image acquisition challenging due to patient body habitus. IMPRESSIONS  1. Left ventricular ejection fraction, by estimation, is 65 to 70%. The left ventricle has hyperdynamic function. The left ventricle has no regional wall motion abnormalities. There is moderate concentric left ventricular hypertrophy. Left ventricular diastolic parameters are consistent with Grade II diastolic dysfunction (pseudonormalization). Elevated left atrial pressure.  2. Right ventricular systolic function is normal. The right ventricular size is normal. Tricuspid regurgitation signal is inadequate for assessing PA pressure.  3. The mitral valve was not well visualized. No evidence of mitral valve regurgitation. No evidence of mitral stenosis.  4. The aortic valve was not well visualized. Aortic valve regurgitation is not visualized. No aortic stenosis is present. FINDINGS  Left Ventricle: Left ventricular ejection fraction, by estimation, is 65 to 70%. The left ventricle has hyperdynamic function. The left ventricle has no regional wall motion abnormalities. Definity contrast agent was given IV to delineate the left ventricular endocardial borders. The left ventricular internal cavity size was normal in size. There is moderate concentric left ventricular hypertrophy. Left ventricular diastolic parameters are consistent with Grade II diastolic dysfunction (pseudonormalization). Elevated left atrial pressure. Right Ventricle: The right ventricular size  is normal. No increase in right ventricular wall thickness. Right ventricular systolic function is normal. Tricuspid regurgitation signal is inadequate for assessing PA pressure. Left Atrium: Left atrial size was not well visualized. Right Atrium: Right atrial size was not well visualized. Pericardium: There is no evidence of pericardial effusion. Mitral Valve: The mitral valve was not well visualized. No evidence of mitral valve regurgitation. No evidence of mitral valve stenosis. Tricuspid Valve: The tricuspid valve is not well visualized. Tricuspid valve regurgitation is not demonstrated. Aortic Valve: Borderline high aortic valve gradients are related  to high cardiac output. The aortic valve was not well visualized. Aortic valve regurgitation is not visualized. No aortic stenosis is present. Aortic valve mean gradient measures 10.0 mmHg.  Aortic valve peak gradient measures 18.0 mmHg. Aortic valve area, by VTI measures 2.48 cm. Pulmonic Valve: The pulmonic valve was not well visualized. Pulmonic valve regurgitation is not visualized. Aorta: The aortic root is normal in size and structure. Venous: The inferior vena cava was not well visualized. IAS/Shunts: The interatrial septum was not well visualized.  LEFT VENTRICLE PLAX 2D LVIDd:         4.30 cm      Diastology LVIDs:         2.90 cm      LV e' medial:    6.31 cm/s LV PW:         1.70 cm      LV E/e' medial:  21.1 LV IVS:        1.50 cm      LV e' lateral:   6.09 cm/s LVOT diam:     2.00 cm      LV E/e' lateral: 21.8 LV SV:         91 LV SV Index:   33 LVOT Area:     3.14 cm  LV Volumes (MOD) LV vol d, MOD A2C: 117.0 ml LV vol d, MOD A4C: 124.0 ml LV vol s, MOD A2C: 34.1 ml LV vol s, MOD A4C: 53.3 ml LV SV MOD A2C:     82.9 ml LV SV MOD A4C:     124.0 ml LV SV MOD BP:      83.8 ml RIGHT VENTRICLE RV S prime:     15.90 cm/s TAPSE (M-mode): 3.2 cm LEFT ATRIUM             Index       RIGHT ATRIUM           Index LA diam:        3.50 cm 1.27 cm/m  RA Area:      19.00 cm LA Vol (A2C):   25.4 ml 9.24 ml/m  RA Volume:   50.80 ml  18.49 ml/m LA Vol (A4C):   38.7 ml 14.08 ml/m LA Biplane Vol: 33.3 ml 12.12 ml/m  AORTIC VALVE AV Area (Vmax):    2.34 cm AV Area (Vmean):   2.07 cm AV Area (VTI):     2.48 cm AV Vmax:           212.00 cm/s AV Vmean:          150.000 cm/s AV VTI:            0.366 m AV Peak Grad:      18.0 mmHg AV Mean Grad:      10.0 mmHg LVOT Vmax:         158.00 cm/s LVOT Vmean:        98.900 cm/s LVOT VTI:          0.289 m LVOT/AV VTI ratio: 0.79  AORTA Ao Root diam: 3.00 cm MITRAL VALVE MV Area (PHT): 3.12 cm     SHUNTS MV Decel Time: 243 msec     Systemic VTI:  0.29 m MV E velocity: 133.00 cm/s  Systemic Diam: 2.00 cm MV A velocity: 99.10 cm/s MV E/A ratio:  1.34 Mihai Croitoru MD Electronically signed by Thurmon Fair MD Signature Date/Time: 11/20/2019/10:25:00 AM    Final      Time Spent in minutes  30  Laverna Peace M.D on 11/21/2019 at 4:02 PM  To page go to [email or web address] - password [facility]

## 2019-11-21 NOTE — TOC Progression Note (Signed)
Transition of Care [facility]) - Progression Note    Patient Details  Name: Shawn Sanders MRN: [id number] Date of Birth: [birth year]  Transition of Care [facility]) CM/SW Contact  Leone Haven, RN Phone Number: 11/21/2019, 4:59 PM  Clinical Narrative:    NCM spoke with patient, he states he has insurance called [employer] and he has PCP Pascal Lux and he has an apt with her on 11/18 coming up.  He is not sure if his insurance will cover medication cost.        Expected Discharge Plan and Services                                                 Social Determinants of Health (SDOH) Interventions    Readmission Risk Interventions No flowsheet data found.

## 2019-11-21 NOTE — Progress Notes (Signed)
   11/20/19 2008  Assess: MEWS Score  Temp 98.5 F (36.9 C)  BP (!) 183/83  Pulse Rate 75  ECG Heart Rate 75  Resp 16  Level of Consciousness Alert  SpO2 98 %  O2 Device Room Air  Assess: MEWS Score  MEWS Temp 0  MEWS Systolic 0  MEWS Pulse 0  MEWS RR 0  MEWS LOC 0  MEWS Score 0  MEWS Score Color Green  Treat  Pain Scale 0-10  Pain Score 0

## 2019-11-22 ENCOUNTER — Other Ambulatory Visit (HOSPITAL_COMMUNITY): Payer: Self-pay | Admitting: Internal Medicine

## 2019-11-22 LAB — BASIC METABOLIC PANEL
Anion gap: 15 (ref 5–15)
Anion gap: 8 (ref 5–15)
BUN: 23 mg/dL — ABNORMAL HIGH (ref 6–20)
BUN: 19 mg/dL (ref 6–20)
CO2: 23 mmol/L (ref 22–32)
CO2: 29 mmol/L (ref 22–32)
Calcium: 9.5 mg/dL (ref 8.9–10.3)
Calcium: 9.4 mg/dL (ref 8.9–10.3)
Chloride: 101 mmol/L (ref 98–111)
Chloride: 99 mmol/L (ref 98–111)
Creatinine, Ser: 1.23 mg/dL (ref 0.61–1.24)
Creatinine, Ser: 1.18 mg/dL (ref 0.61–1.24)
GFR, Estimated: >60 mL/min (ref >60)
GFR, Estimated: >60 mL/min (ref >60)
Glucose, Bld: 110 mg/dL — ABNORMAL HIGH (ref 70–99)
Glucose, Bld: 103 mg/dL — ABNORMAL HIGH (ref 70–99)
Potassium: 5.5 mmol/L — ABNORMAL HIGH (ref 3.5–5.1)
Potassium: 4 mmol/L (ref 3.5–5.1)
Sodium: 139 mmol/L (ref 135–145)
Sodium: 136 mmol/L (ref 135–145)

## 2019-11-22 LAB — CBC
HCT: 47.2 % (ref 39.0–52.0)
Hemoglobin: 15.8 g/dL (ref 13.0–17.0)
MCH: 29 pg (ref 26.0–34.0)
MCHC: 33.5 g/dL (ref 30.0–36.0)
MCV: 86.8 fL (ref 80.0–100.0)
Platelets: 263 10*3/uL (ref 150–400)
RBC: 5.44 MIL/uL (ref 4.22–5.81)
RDW: 14.6 % (ref 11.5–15.5)
WBC: 7.1 10*3/uL (ref 4.0–10.5)
nRBC: 0 % (ref 0.0–0.2)

## 2019-11-22 MED ORDER — ASPIRIN EC 81 MG PO TBEC: 81.0000 mg | DELAYED_RELEASE_TABLET | Freq: Every day | ORAL | 11 refills | Status: DC

## 2019-11-22 MED ORDER — HYDRALAZINE HCL 25 MG PO TABS
50.0000 mg | ORAL_TABLET | Freq: Three times a day (TID) | ORAL | 1 refills | Status: DC
Start: 2019-11-22 — End: 2019-12-13

## 2019-11-22 MED ORDER — FLUCONAZOLE 150 MG PO TABS
150.0000 mg | ORAL_TABLET | ORAL | 0 refills | Status: DC
Start: 2019-11-26 — End: 2020-04-14

## 2019-11-22 MED ORDER — CLONIDINE HCL 0.1 MG PO TABS
0.1000 mg | ORAL_TABLET | Freq: Once | ORAL | Status: AC
  Administered 2019-11-22: 0.1 mg via ORAL
  Filled 2019-11-22: qty 1

## 2019-11-22 MED ORDER — HYDRALAZINE HCL 50 MG PO TABS
50.0000 mg | ORAL_TABLET | Freq: Three times a day (TID) | ORAL | Status: DC
  Administered 2019-11-22: 50 mg via ORAL
  Filled 2019-11-22: qty 1

## 2019-11-22 MED ORDER — FUROSEMIDE 40 MG PO TABS
40.0000 mg | ORAL_TABLET | Freq: Every day | ORAL | 1 refills | Status: DC
Start: 2019-11-23 — End: 2019-12-13

## 2019-11-22 MED ORDER — AMLODIPINE BESYLATE 10 MG PO TABS
10.0000 mg | ORAL_TABLET | Freq: Every day | ORAL | 1 refills | Status: DC
Start: 2019-11-23 — End: 2019-12-28

## 2019-11-22 MED FILL — hydrALAZINE HCL 50 MG TABS: 50 | 30 days supply | Qty: 90 | Fill #0

## 2019-11-22 MED FILL — FUROSEMIDE 40 MG TABLET: 40 | 30 days supply | Qty: 30 | Fill #0

## 2019-11-22 MED FILL — AMLODIPINE BESYLATE 10 MG T: 10 | 45 days supply | Qty: 45 | Fill #0

## 2019-11-22 MED FILL — ASPIRIN LOW DOSE 81 MG TBEC: 81 | 30 days supply | Qty: 30 | Fill #0

## 2019-11-22 MED FILL — FLUCONAZOLE 150 MG TABS: 150 | 21 days supply | Qty: 3 | Fill #0

## 2019-11-22 NOTE — Discharge Summary (Signed)
Shawn Sanders PFY:[id number] DOB: Jan 27, 1970 DOA: 11/18/2019  PCP: Shawn Sanders  Admit date: 11/18/2019 Discharge date: 11/22/2019  Admitted From: home Disposition:  home  Recommendations for Outpatient Follow-up:  1. Follow up with PCP on 11/29/19 2. New medications: amlodipine, hydralazine, lasix 3. Consider outpatient sleep study   Discharge Condition:Stable  CODE STATUS:FULL code  Diet recommendation: heart healthy  Brief/Interim Summary: History of present illness:  Shawn Sanders is a 49 year old male with medical history significant for hypertension not currently on any medications and obesity class III who presented with several weeks of worsening lower leg swelling, orthopnea, PND, episode of atypical chest pain with exertion and was admitted for diagnosis of hypertensive emergency.  On presentation blood pressure 233/134, slightly elevated troponin of 34 quickly down to 231, unremarkable BNP and CT head negative no findings on chest x-ray.  Given shortness of breath patient was started on IV Lasix in the ED with noted improvement of symptoms.  Hospital course patient was found to have grade 2 diastolic dysfunction preserved EF on TTE.  Patient was started on amlodipine and continued on scheduled IV Lasix with continued improvement in respiratory symptoms as well as fluid overload on exam.  Hydralazine was added for additional support in addition to transition to oral lasix for BP control.   Hospital Course:   Hypertensive emergency, Resolved. Much better control on amlodipine, hydralazine and oral lasix. Denies any chest pain, reports significant improvement in shortness of breath, exam also notably less fluid overloaded -continue amlodipine 10 mg, hydralazine 50 mg TID, lasix 40 mg qd -Also suggest a outpatient sleep study ( he reports daytime somnolence and snoring) to rule out OSA possible further contributor to poorly controlled hypertension  Diastolic CHF, new diagnosis,  likely presented with acute exacerbation in setting of hypertensive emergency,   Improving.   TTE confirms preserved EF with grade 2 diastolic dysfunction. Volume overloaded improved significantly with IV lasix and remained stable on transition to oral lasix. No O2 requirements, significant improvement in DOE -oral lasix on dischage --BMP with PCP recommended on follow up  Obesity, class III BMI greater than 46.  Patient states he has had significant increase in his weight since being diagnosed with Covid in August of this year. -Lifestyle changes recommended -Need close follow-up with PCP      Consultations:  none  Procedures/Studies: TTE, 11/20/19  1. Left ventricular ejection fraction, by estimation, is 65 to 70%. The  left ventricle has hyperdynamic function. The left ventricle has no  regional wall motion abnormalities. There is moderate concentric left  ventricular hypertrophy. Left ventricular  diastolic parameters are consistent with Grade II diastolic dysfunction  (pseudonormalization). Elevated left atrial pressure.  2. Right ventricular systolic function is normal. The right ventricular  size is normal. Tricuspid regurgitation signal is inadequate for assessing  PA pressure.  3. The mitral valve was not well visualized. No evidence of mitral valve  regurgitation. No evidence of mitral stenosis.  4. The aortic valve was not well visualized. Aortic valve regurgitation  is not visualized. No aortic stenosis is present.       Subjective:  Discharge Exam: Vitals:   11/22/19 1220 11/22/19 1735  BP: (!) 191/90 (!) 148/63  Pulse: 78 81  Resp: 16   Temp: 98.1 F (36.7 C) 98.1 F (36.7 C)  SpO2: 95%    Vitals:   11/22/19 0431 11/22/19 0434 11/22/19 1220 11/22/19 1735  BP:  (!) 164/94 (!) 191/90 (!) 148/63  Pulse: 87 85 78  81  Resp:  18 16   Temp: 97.6 F (36.4 C) 97.6 F (36.4 C) 98.1 F (36.7 C) 98.1 F (36.7 C)  TempSrc: Oral Oral Oral Oral   SpO2: 95% 95% 95%   Weight:  (!) 161.3 kg    Height:        General: Lying in bed, no apparent distress Eyes: EOMI, anicteric ENT: Oral Mucosa clear and moist Cardiovascular: regular rate and rhythm, no murmurs, rubs or gallops, trace edema of bilateral lower extremities Respiratory: Normal respiratory effort on room air, lungs clear to auscultation bilaterally Abdomen: soft, non-distended, non-tender, normal bowel sounds Skin: No Rash Neurologic: Grossly no focal neuro deficit.Mental status AAOx3, speech normal, Psychiatric:Appropriate affect, and mood  Discharge Diagnoses:  Principal Problem:   Hypertensive emergency Active Problems:   HTN (hypertension)   Tinea cruris   Acute congestive heart failure (HCC)   Acute diastolic CHF (congestive heart failure) (HCC)   Obesity, Class III, BMI 40-49.9 (morbid obesity) (HCC)    Discharge Instructions  Discharge Instructions    Diet - low sodium heart healthy   Complete by: As directed    Increase activity slowly   Complete by: As directed      Allergies as of 11/22/2019      Reactions   Penicillins Anaphylaxis   Olive Oil    Blisters    Other    Lobster: Swelling    Raspberry    Swelling       Medication List    STOP taking these medications   aspirin 325 MG tablet Replaced by: aspirin EC 81 MG tablet     TAKE these medications   acetaminophen 500 MG tablet Commonly known as: TYLENOL Take 500 mg by mouth every 6 (six) hours as needed for mild pain.   amLODipine 10 MG tablet Commonly known as: NORVASC Take 1 tablet (10 mg total) by mouth daily. Start taking on: November 23, 2019   aspirin EC 81 MG tablet Take 1 tablet (81 mg total) by mouth daily. Swallow whole. Replaces: aspirin 325 MG tablet   fluconazole 150 MG tablet Commonly known as: DIFLUCAN Take 1 tablet (150 mg total) by mouth once a week. Start taking on: November 26, 2019   furosemide 40 MG tablet Commonly known as: LASIX Take 1 tablet  (40 mg total) by mouth daily. Start taking on: November 23, 2019   hydrALAZINE 25 MG tablet Commonly known as: APRESOLINE Take 2 tablets (50 mg total) by mouth every 8 (eight) hours.       Allergies  Allergen Reactions  . Penicillins Anaphylaxis  . Olive Oil     Blisters   . Other     Lobster: Swelling   . Raspberry     Swelling         The results of significant diagnostics from this hospitalization (including imaging, microbiology, ancillary and laboratory) are listed below for reference.     Microbiology: Recent Results (from the past 240 hour(s))  Respiratory Panel by RT PCR (Flu A&B, Covid) - Nasopharyngeal Swab     Status: None   Collection Time: 11/18/19 10:12 PM   Specimen: Nasopharyngeal Swab  Result Value Ref Range Status   SARS Coronavirus 2 by RT PCR NEGATIVE NEGATIVE Final    Comment: (NOTE) SARS-CoV-2 target nucleic acids are NOT DETECTED.  The SARS-CoV-2 RNA is generally detectable in upper respiratoy specimens during the acute phase of infection. The lowest concentration of SARS-CoV-2 viral copies this assay can detect is 131  copies/mL. A negative result does not preclude SARS-Cov-2 infection and should not be used as the sole basis for treatment or other patient management decisions. A negative result may occur with  improper specimen collection/handling, submission of specimen other than nasopharyngeal swab, presence of viral mutation(s) within the areas targeted by this assay, and inadequate number of viral copies (<131 copies/mL). A negative result must be combined with clinical observations, patient history, and epidemiological information. The expected result is Negative.  Fact Sheet for Patients:  https://www.moore.com/  Fact Sheet for Healthcare Providers:  https://www.young.biz/  This test is no t yet approved or cleared by the [place] FDA and  has been authorized for detection and/or  diagnosis of SARS-CoV-2 by FDA under an Emergency Use Authorization (EUA). This EUA will remain  in effect (meaning this test can be used) for the duration of the COVID-19 declaration under Section 564(b)(1) of the Act, 21 U.S.C. section 360bbb-3(b)(1), unless the authorization is terminated or revoked sooner.     Influenza A by PCR NEGATIVE NEGATIVE Final   Influenza B by PCR NEGATIVE NEGATIVE Final    Comment: (NOTE) The Xpert Xpress SARS-CoV-2/FLU/RSV assay is intended as an aid in  the diagnosis of influenza from Nasopharyngeal swab specimens and  should not be used as a sole basis for treatment. Nasal washings and  aspirates are unacceptable for Xpert Xpress SARS-CoV-2/FLU/RSV  testing.  Fact Sheet for Patients: https://www.moore.com/  Fact Sheet for Healthcare Providers: https://www.young.biz/  This test is not yet approved or cleared by the [place] FDA and  has been authorized for detection and/or diagnosis of SARS-CoV-2 by  FDA under an Emergency Use Authorization (EUA). This EUA will remain  in effect (meaning this test can be used) for the duration of the  Covid-19 declaration under Section 564(b)(1) of the Act, 21  U.S.C. section 360bbb-3(b)(1), unless the authorization is  terminated or revoked. Performed at [facility], [address] [place], [place] [address]      Labs: BNP (last 3 results) Recent Labs    11/18/19 2117  BNP 88.0   Basic Metabolic Panel: Recent Labs  Lab 11/18/19 2117 11/20/19 0534 11/21/19 0405 11/22/19 0548 11/22/19 1153  NA 136 136 137 139 136  K 4.5 3.6 4.7 5.5* 4.0  CL 98 98 99 101 99  CO2 27 28 25 23 29   GLUCOSE 102* 128* 95 110* 103*  BUN 19 16 21* 23* 19  CREATININE 1.06 1.18 1.28* 1.23 1.18  CALCIUM 9.6 9.8 9.7 9.5 9.4   Liver Function Tests: Recent Labs  Lab 11/18/19 2117  AST 21  ALT 31  ALKPHOS 74  BILITOT 1.0  PROT 7.9  ALBUMIN 4.1   No results for  input(s): LIPASE, AMYLASE in the last 168 hours. No results for input(s): AMMONIA in the last 168 hours. CBC: Recent Labs  Lab 11/18/19 2117 11/20/19 0534 11/22/19 0548  WBC 8.2 7.7 7.1  NEUTROABS 5.5  --   --   HGB 14.3 15.1 15.8  HCT 43.4 46.2 47.2  MCV 86.5 86.7 86.8  PLT 252 259 263   Cardiac Enzymes: No results for input(s): CKTOTAL, CKMB, CKMBINDEX, TROPONINI in the last 168 hours. BNP: Invalid input(s): POCBNP CBG: No results for input(s): GLUCAP in the last 168 hours. D-Dimer No results for input(s): DDIMER in the last 72 hours. Hgb A1c No results for input(s): HGBA1C in the last 72 hours. Lipid Profile No results for input(s): CHOL, HDL, LDLCALC, TRIG, CHOLHDL, LDLDIRECT in  the last 72 hours. Thyroid function studies No results for input(s): TSH, T4TOTAL, T3FREE, THYROIDAB in the last 72 hours.  Invalid input(s): FREET3 Anemia work up No results for input(s): VITAMINB12, FOLATE, FERRITIN, TIBC, IRON, RETICCTPCT in the last 72 hours. Urinalysis No results found for: COLORURINE, APPEARANCEUR, LABSPEC, PHURINE, GLUCOSEU, HGBUR, BILIRUBINUR, KETONESUR, PROTEINUR, UROBILINOGEN, NITRITE, LEUKOCYTESUR Sepsis Labs Invalid input(s): PROCALCITONIN,  WBC,  LACTICIDVEN Microbiology Recent Results (from the past 240 hour(s))  Respiratory Panel by RT PCR (Flu A&B, Covid) - Nasopharyngeal Swab     Status: None   Collection Time: 11/18/19 10:12 PM   Specimen: Nasopharyngeal Swab  Result Value Ref Range Status   SARS Coronavirus 2 by RT PCR NEGATIVE NEGATIVE Final    Comment: (NOTE) SARS-CoV-2 target nucleic acids are NOT DETECTED.  The SARS-CoV-2 RNA is generally detectable in upper respiratoy specimens during the acute phase of infection. The lowest concentration of SARS-CoV-2 viral copies this assay can detect is 131 copies/mL. A negative result does not preclude SARS-Cov-2 infection and should not be used as the sole basis for treatment or other patient management  decisions. A negative result may occur with  improper specimen collection/handling, submission of specimen other than nasopharyngeal swab, presence of viral mutation(s) within the areas targeted by this assay, and inadequate number of viral copies (<131 copies/mL). A negative result must be combined with clinical observations, patient history, and epidemiological information. The expected result is Negative.  Fact Sheet for Patients:  https://www.moore.com/https://www.fda.gov/media/142436/download  Fact Sheet for Healthcare Providers:  https://www.young.biz/https://www.fda.gov/media/142435/download  This test is no t yet approved or cleared by the [place] FDA and  has been authorized for detection and/or diagnosis of SARS-CoV-2 by FDA under an Emergency Use Authorization (EUA). This EUA will remain  in effect (meaning this test can be used) for the duration of the COVID-19 declaration under Section 564(b)(1) of the Act, 21 U.S.C. section 360bbb-3(b)(1), unless the authorization is terminated or revoked sooner.     Influenza A by PCR NEGATIVE NEGATIVE Final   Influenza B by PCR NEGATIVE NEGATIVE Final    Comment: (NOTE) The Xpert Xpress SARS-CoV-2/FLU/RSV assay is intended as an aid in  the diagnosis of influenza from Nasopharyngeal swab specimens and  should not be used as a sole basis for treatment. Nasal washings and  aspirates are unacceptable for Xpert Xpress SARS-CoV-2/FLU/RSV  testing.  Fact Sheet for Patients: https://www.moore.com/https://www.fda.gov/media/142436/download  Fact Sheet for Healthcare Providers: https://www.young.biz/https://www.fda.gov/media/142435/download  This test is not yet approved or cleared by the [place] FDA and  has been authorized for detection and/or diagnosis of SARS-CoV-2 by  FDA under an Emergency Use Authorization (EUA). This EUA will remain  in effect (meaning this test can be used) for the duration of the  Covid-19 declaration under Section 564(b)(1) of the Act, 21  U.S.C. section 360bbb-3(b)(1), unless the  authorization is  terminated or revoked. Performed at [facility], [address] [place], [place] [address]      Time coordinating discharge: Over 30 minutes  SIGNED:   Laverna PeaceShayla D Hazyl Marseille, MD  [facility] 11/22/2019, 6:50 PM Pager   If 7PM-7AM, please contact night-coverage [email or web address] Password [email or web address]

## 2019-12-12 DIAGNOSIS — I10 Essential (primary) hypertension: Secondary | ICD-10-CM | POA: Insufficient documentation

## 2019-12-13 ENCOUNTER — Ambulatory Visit (INDEPENDENT_AMBULATORY_CARE_PROVIDER_SITE_OTHER): Payer: PRIVATE HEALTH INSURANCE | Admitting: Cardiology

## 2019-12-13 ENCOUNTER — Other Ambulatory Visit: Payer: Self-pay

## 2019-12-13 ENCOUNTER — Encounter: Payer: Self-pay | Admitting: Cardiology

## 2019-12-13 VITALS — BP 152/74 | HR 87 | Ht 73.0 in | Wt 364.4 lb

## 2019-12-13 DIAGNOSIS — I10 Essential (primary) hypertension: Secondary | ICD-10-CM | POA: Diagnosis not present

## 2019-12-13 DIAGNOSIS — I83893 Varicose veins of bilateral lower extremities with other complications: Secondary | ICD-10-CM

## 2019-12-13 DIAGNOSIS — R600 Localized edema: Secondary | ICD-10-CM

## 2019-12-13 DIAGNOSIS — I5032 Chronic diastolic (congestive) heart failure: Secondary | ICD-10-CM

## 2019-12-13 DIAGNOSIS — R0602 Shortness of breath: Secondary | ICD-10-CM | POA: Diagnosis not present

## 2019-12-13 MED ORDER — SPIRONOLACTONE 25 MG PO TABS: 25.0000 mg | ORAL_TABLET | Freq: Every day | ORAL | 3 refills | Status: DC

## 2019-12-13 NOTE — Patient Instructions (Addendum)
Medication Instructions:  1) Start Spironolactone (Aldactone) 25 mg daily   2) Increase Furosemide (Lasix) 40 mg twice a day for 2 weeks   *If you need a refill on your cardiac medications before your next appointment, please call your pharmacy*   Lab Work: Bmp, magnesium, pro bnp - today   If you have labs (blood work) drawn today and your tests are completely normal, you will receive your results only by: Marland Kitchen MyChart Message (if you have MyChart) OR . A paper copy in the mail If you have any lab test that is abnormal or we need to change your treatment, we will call you to review the results.   Testing/Procedures: None ordered    Follow-Up: At [facility], you and your health needs are our priority.  As part of our continuing mission to provide you with exceptional heart care, we have created designated Provider Care Teams.  These Care Teams include your primary Cardiologist (physician) and Advanced Practice Providers (APPs -  Physician Assistants and Nurse Practitioners) who all work together to provide you with the care you need, when you need it.  We recommend signing up for the patient portal called "MyChart".  Sign up information is provided on this After Visit Summary.  MyChart is used to connect with patients for Virtual Visits (Telemedicine).  Patients are able to view lab/test results, encounter notes, upcoming appointments, etc.  Non-urgent messages can be sent to your provider as well.   To learn more about what you can do with MyChart, go to [email or web address].    Your next appointment:   2 week(s)  The format for your next appointment:   In Person  Provider:   Thomasene Ripple, DO   Other Instructions None

## 2019-12-14 DIAGNOSIS — I5032 Chronic diastolic (congestive) heart failure: Secondary | ICD-10-CM | POA: Insufficient documentation

## 2019-12-14 DIAGNOSIS — I83893 Varicose veins of bilateral lower extremities with other complications: Secondary | ICD-10-CM | POA: Insufficient documentation

## 2019-12-14 DIAGNOSIS — R600 Localized edema: Secondary | ICD-10-CM | POA: Insufficient documentation

## 2019-12-14 DIAGNOSIS — R0602 Shortness of breath: Secondary | ICD-10-CM | POA: Insufficient documentation

## 2019-12-14 LAB — PRO B NATRIURETIC PEPTIDE: NT-Pro BNP: 115 pg/mL (ref 0–121)

## 2019-12-14 LAB — BASIC METABOLIC PANEL
BUN/Creatinine Ratio: 16 (ref 9–20)
BUN: 15 mg/dL (ref 6–24)
CO2: 25 mmol/L (ref 20–29)
Calcium: 9.8 mg/dL (ref 8.7–10.2)
Chloride: 96 mmol/L (ref 96–106)
Creatinine, Ser: 0.91 mg/dL (ref 0.76–1.27)
GFR calc Af Amer: 113 mL/min/{1.73_m2} (ref >59)
GFR calc non Af Amer: 98 mL/min/{1.73_m2} (ref >59)
Glucose: 87 mg/dL (ref 65–99)
Potassium: 4.1 mmol/L (ref 3.5–5.2)
Sodium: 137 mmol/L (ref 134–144)

## 2019-12-14 LAB — MAGNESIUM: Magnesium: 2 mg/dL (ref 1.6–2.3)

## 2019-12-14 NOTE — Progress Notes (Signed)
Cardiology Office Note:    Date:  12/14/2019   ID:  Shawn Oman., DOB 12-Sep-1970, MRN [id number]  PCP:  Jim Like, NP  Cardiologist:  Thomasene Ripple, DO  Electrophysiologist:  None   Referring MD: Jim Like, NP   " I am short of breath with worsening leg swelling  History of Present Illness:    Shawn Lott. is a 49 y.o. male with a hx of hypertension, obesity class III-morbid obesity, worsening leg edema comes today to be evaluated for leg edema and chest pain.  Patient was recently admitted to [facility] on November 18, 2019 and was discharged on November 22, 2019.  At the time of admission his systolic blood pressure was in the 200s.  He was started on antihypertensive medications which help improve this.  He was also diuresed while in the hospital he did have some respiratory conditions. The patient tells me that he never had any symptoms prior to his COVID-19 infection which was several weeks ago once he had a cold that his blood pressure continue to go up and he got significantly short of breath. Since his hospitalization her shortness of breath has not improved.  He saw his PCP who recommended he see cardiology.  Past Medical History:  Diagnosis Date  . Acute congestive heart failure (HCC) 11/20/2019  . Acute diastolic CHF (congestive heart failure) (HCC) 11/21/2019  . HTN (hypertension) 11/19/2019  . Hypertension   . Hypertensive emergency 11/20/2019  . 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 Tinea cruris 11/19/2019    Past Surgical History:  Procedure Laterality Date  . NO PAST SURGERIES      Current Medications: Current Meds  Medication Sig  . acetaminophen (TYLENOL) 500 MG tablet Take 500 mg by mouth every 6 (six) hours as needed for mild pain.  Marland Kitchen amLODipine (NORVASC) 10 MG tablet Take 1 tablet (10 mg total) by mouth daily.  Marland Kitchen aspirin EC 81 MG tablet Take 1 tablet (81 mg total) by mouth daily. Swallow  whole.  . furosemide (LASIX) 40 MG tablet Take 40 mg by mouth 2 (two) times daily.   . hydrALAZINE (APRESOLINE) 50 MG tablet Take 50 mg by mouth every 8 (eight) hours.  Marland Kitchen losartan (COZAAR) 25 MG tablet Take 25 mg by mouth at bedtime.  Marland Kitchen spironolactone (ALDACTONE) 25 MG tablet Take 1 tablet (25 mg total) by mouth daily.  . Vitamin D, Ergocalciferol, (DRISDOL) 1.25 MG (50000 UNIT) CAPS capsule Take 50,000 Units by mouth once a week.  . [DISCONTINUED] furosemide (LASIX) 40 MG tablet Take 1 tablet (40 mg total) by mouth daily.     Allergies:   Penicillins, Olive oil, Other, and Raspberry   Social History   Socioeconomic History  . Marital status: Married    Spouse name: Not on file  . Number of children: Not on file  . Years of education: Not on file  . Highest education level: Not on file  Occupational History  . Not on file  Tobacco Use  . Smoking status: Never Smoker  . Smokeless tobacco: Never Used  Vaping Use  . Vaping Use: Never used  Substance and Sexual Activity  . Alcohol use: Never  . Drug use: Never  . Sexual activity: Not on file  Other Topics Concern  . Not on file  Social History Narrative  . Not on file   Social Determinants of Health   Financial Resource Strain:   .  Difficulty of Paying Living Expenses: Not on file  Food Insecurity:   . Worried About Programme researcher, broadcasting/film/video in the Last Year: Not on file  . Ran Out of Food in the Last Year: Not on file  Transportation Needs:   . Lack of Transportation (Medical): Not on file  . Lack of Transportation (Non-Medical): Not on file  Physical Activity:   . Days of Exercise per Week: Not on file  . Minutes of Exercise per Session: Not on file  Stress:   . Feeling of Stress : Not on file  Social Connections:   . Frequency of Communication with Friends and Family: Not on file  . Frequency of Social Gatherings with Friends and Family: Not on file  . Attends Religious Services: Not on file  . Active Member of Clubs or  Organizations: Not on file  . Attends Banker Meetings: Not on file  . Marital Status: Not on file     Family History: The patient's family history includes Breast cancer in his mother; Hypertension in his father.  ROS:   Review of Systems  Constitution: Negative for decreased appetite, fever and weight gain.  HENT: Negative for congestion, ear discharge, hoarse voice and sore throat.   Eyes: Negative for discharge, redness, vision loss in right eye and visual halos.  Cardiovascular: Negative for chest pain, dyspnea on exertion, leg swelling, orthopnea and palpitations.  Respiratory: Negative for cough, hemoptysis, shortness of breath and snoring.   Endocrine: Negative for heat intolerance and polyphagia.  Hematologic/Lymphatic: Negative for bleeding problem. Does not bruise/bleed easily.  Skin: Negative for flushing, nail changes, rash and suspicious lesions.  Musculoskeletal: Negative for arthritis, joint pain, muscle cramps, myalgias, neck pain and stiffness.  Gastrointestinal: Negative for abdominal pain, bowel incontinence, diarrhea and excessive appetite.  Genitourinary: Negative for decreased libido, genital sores and incomplete emptying.  Neurological: Negative for brief paralysis, focal weakness, headaches and loss of balance.  Psychiatric/Behavioral: Negative for altered mental status, depression and suicidal ideas.  Allergic/Immunologic: Negative for HIV exposure and persistent infections.    EKGs/Labs/Other Studies Reviewed:    The following studies were reviewed today:   EKG: Sinus rhythm, heart rate 87 bpm nonspecific ST changes   Transthoracic echocardiogramIMPRESSIONS  1. Left ventricular ejection fraction, by estimation, is 65 to 70%. The left ventricle has hyperdynamic function. The left ventricle has no regional wall motion abnormalities. There is moderate concentric left  ventricular hypertrophy. Left ventricular diastolic parameters are consistent  with Grade II diastolic dysfunction  (pseudonormalization). Elevated left atrial pressure.  2. Right ventricular systolic function is normal. The right ventricular size is normal. Tricuspid regurgitation signal is inadequate for assessing PA pressure.  3. The mitral valve was not well visualized. No evidence of mitral valve regurgitation. No evidence of mitral stenosis.  4. The aortic valve was not well visualized. Aortic valve regurgitation is not visualized. No aortic stenosis is present.   Recent Labs: 11/18/2019: ALT 31; B Natriuretic Peptide 88.0 11/22/2019: Hemoglobin 15.8; Platelets 263 12/13/2019: BUN 15; Creatinine, Ser 0.91; Magnesium 2.0; NT-Pro BNP 115; Potassium 4.1; Sodium 137  Recent Lipid Panel No results found for: CHOL, TRIG, HDL, CHOLHDL, VLDL, LDLCALC, LDLDIRECT  Physical Exam:    VS:  BP (!) 152/74 (BP Location: Left Arm)   Pulse 87   Ht 6\' 1"  (1.854 m)   Wt (!) 364 lb 6.4 oz (165.3 kg)   SpO2 97%   BMI 48.08 kg/m     Wt Readings from Last 3  Encounters:  12/13/19 (!) 364 lb 6.4 oz (165.3 kg)  11/22/19 (!) 355 lb 8 oz (161.3 kg)     GEN: Well nourished, well developed in no acute distress HEENT: Normal NECK: No JVD; No carotid bruits LYMPHATICS: No lymphadenopathy CARDIAC: S1S2 noted,RRR, no murmurs, rubs, gallops RESPIRATORY:  Clear to auscultation without rales, wheezing or rhonchi  ABDOMEN: Soft, non-tender, non-distended, +bowel sounds, no guarding. EXTREMITIES:bilateral leg  Edema +2, No cyanosis, no clubbing MUSCULOSKELETAL:  No deformity  SKIN: Warm and dry NEUROLOGIC:  Alert and oriented x 3, non-focal PSYCHIATRIC:  Normal affect, good insight  ASSESSMENT:    1. SOB (shortness of breath)   2. Primary hypertension   3. Obesity, Class III, BMI 40-49.9 (morbid obesity) (HCC)   4. Bilateral leg edema   5. Chronic diastolic heart failure (HCC)   6. Varicose veins of both legs with edema    PLAN:     He is hypertensive in the office today  with leg edema.  I am going to increase his Lasix to 40 mg twice a day for 2 weeks.  He also is hypertensive so I will add Aldactone to his medication regimen.  We will see the patient back in 2 weeks.  Blood work will be done today to assess electrolyte and kidney function. I reviewed his echocardiogram that was done in the hospital and explained to the patient he has no questions at this time. I do believe his shortness of breath is multifactorial I think he is deconditioned as well he needs to increase exercise significant obesity could also be causing this. His leg edema is also multifactorial his diastolic heart failure with varicose vein we will continue to diurese for now. He needs a sleep study he is working this out with his primary care doctor he prefers to keep the arrangements with his primary care doctor.   The patient is in agreement with the above plan. The patient left the office in stable condition.  The patient will follow up in 2 weeks or sooner if needed.   Medication Adjustments/Labs and Tests Ordered: Current medicines are reviewed at length with the patient today.  Concerns regarding medicines are outlined above.  Orders Placed This Encounter  Procedures  . Basic metabolic panel  . Magnesium  . Pro b natriuretic peptide (BNP)  . EKG 12-Lead   Meds ordered this encounter  Medications  . spironolactone (ALDACTONE) 25 MG tablet    Sig: Take 1 tablet (25 mg total) by mouth daily.    Dispense:  90 tablet    Refill:  3    Patient Instructions  Medication Instructions:  1) Start Spironolactone (Aldactone) 25 mg daily   2) Increase Furosemide (Lasix) 40 mg twice a day for 2 weeks   *If you need a refill on your cardiac medications before your next appointment, please call your pharmacy*   Lab Work: Bmp, magnesium, pro bnp - today   If you have labs (blood work) drawn today and your tests are completely normal, you will receive your results only by: Marland Kitchen. MyChart  Message (if you have MyChart) OR . A paper copy in the mail If you have any lab test that is abnormal or we need to change your treatment, we will call you to review the results.   Testing/Procedures: None ordered    Follow-Up: At [facility], you and your health needs are our priority.  As part of our continuing mission to provide you with exceptional heart care, we  have created designated Provider Care Teams.  These Care Teams include your primary Cardiologist (physician) and Advanced Practice Providers (APPs -  Physician Assistants and Nurse Practitioners) who all work together to provide you with the care you need, when you need it.  We recommend signing up for the patient portal called "MyChart".  Sign up information is provided on this After Visit Summary.  MyChart is used to connect with patients for Virtual Visits (Telemedicine).  Patients are able to view lab/test results, encounter notes, upcoming appointments, etc.  Non-urgent messages can be sent to your provider as well.   To learn more about what you can do with MyChart, go to [email or web address].    Your next appointment:   2 week(s)  The format for your next appointment:   In Person  Provider:   Thomasene Ripple, DO   Other Instructions None      Adopting a Healthy Lifestyle.  Know what a healthy weight is for you (roughly BMI <25) and aim to maintain this   Aim for 7+ servings of fruits and vegetables daily   65-80+ fluid ounces of water or unsweet tea for healthy kidneys   Limit to max 1 drink of alcohol per day; avoid smoking/tobacco   Limit animal fats in diet for cholesterol and heart health - choose grass fed whenever available   Avoid highly processed foods, and foods high in saturated/trans fats   Aim for low stress - take time to unwind and care for your mental health   Aim for 150 min of moderate intensity exercise weekly for heart health, and weights twice weekly for bone health   Aim for  7-9 hours of sleep daily   When it comes to diets, agreement about the perfect plan isnt easy to find, even among the experts. Experts at the [facility] [employer] developed an idea known as the Healthy Eating Plate. Just imagine a plate divided into logical, healthy portions.   The emphasis is on diet quality:   Load up on vegetables and fruits - one-half of your plate: Aim for color and variety, and remember that potatoes dont count.   Go for whole grains - one-quarter of your plate: Whole wheat, barley, wheat berries, quinoa, oats, brown rice, and foods made with them. If you want pasta, go with whole wheat pasta.   Protein power - one-quarter of your plate: Fish, chicken, beans, and nuts are all healthy, versatile protein sources. Limit red meat.   The diet, however, does go beyond the plate, offering a few other suggestions.   Use healthy plant oils, such as olive, canola, soy, corn, sunflower and peanut. Check the labels, and avoid partially hydrogenated oil, which have unhealthy trans fats.   If youre thirsty, drink water. Coffee and tea are good in moderation, but skip sugary drinks and limit milk and dairy products to one or two daily servings.   The type of carbohydrate in the diet is more important than the amount. Some sources of carbohydrates, such as vegetables, fruits, whole grains, and beans-are healthier than others.   Finally, stay active  Signed, Thomasene Ripple, DO  12/14/2019 9:37 AM    [facility]

## 2019-12-17 ENCOUNTER — Other Ambulatory Visit: Payer: Self-pay | Admitting: Cardiology

## 2019-12-17 ENCOUNTER — Other Ambulatory Visit: Payer: Self-pay

## 2019-12-17 MED ORDER — FUROSEMIDE 40 MG PO TABS
40.0000 mg | ORAL_TABLET | Freq: Two times a day (BID) | ORAL | 2 refills | Status: DC
Start: 2019-12-17 — End: 2020-01-14

## 2019-12-17 MED ORDER — ASPIRIN EC 81 MG PO TBEC: 81.0000 mg | DELAYED_RELEASE_TABLET | Freq: Every day | ORAL | 2 refills | Status: AC

## 2019-12-17 NOTE — Telephone Encounter (Signed)
*  STAT* If patient is at the pharmacy, call can be transferred to refill team.   1. Which medications need to be refilled? (please list name of each medication and dose if known)  furosemide (LASIX) 40 MG tablet aspirin EC 81 MG tablet   2. Which pharmacy/location (including street and city if local pharmacy) is medication to be sent to? [facility] [place] [address]  3. Do they need a 30 day or 90 day supply? 90 day   Patient is out of medication

## 2019-12-17 NOTE — Telephone Encounter (Signed)
Per Dr. Servando Salina verbalized ok to fill Lasix 40 and ASA 81

## 2019-12-17 NOTE — Telephone Encounter (Signed)
LM to return my call. Per Dr. Servando Salina ok to fill Lasix 20 and ASA.

## 2019-12-28 ENCOUNTER — Other Ambulatory Visit: Payer: Self-pay

## 2019-12-28 ENCOUNTER — Ambulatory Visit (INDEPENDENT_AMBULATORY_CARE_PROVIDER_SITE_OTHER): Payer: PRIVATE HEALTH INSURANCE | Admitting: Cardiology

## 2019-12-28 ENCOUNTER — Encounter: Payer: Self-pay | Admitting: Cardiology

## 2019-12-28 VITALS — BP 150/80 | HR 96 | Ht 73.0 in | Wt 357.2 lb

## 2019-12-28 DIAGNOSIS — I10 Essential (primary) hypertension: Secondary | ICD-10-CM | POA: Diagnosis not present

## 2019-12-28 DIAGNOSIS — I5032 Chronic diastolic (congestive) heart failure: Secondary | ICD-10-CM

## 2019-12-28 DIAGNOSIS — R600 Localized edema: Secondary | ICD-10-CM

## 2019-12-28 MED ORDER — AMLODIPINE BESYLATE 10 MG PO TABS
10.0000 mg | ORAL_TABLET | Freq: Every day | ORAL | 3 refills | Status: DC
Start: 2019-12-28 — End: 2021-01-15

## 2019-12-28 MED ORDER — LOSARTAN POTASSIUM 50 MG PO TABS: 50.0000 mg | ORAL_TABLET | Freq: Two times a day (BID) | ORAL | 3 refills | Status: DC

## 2019-12-28 MED ORDER — LOSARTAN POTASSIUM 50 MG PO TABS: 50.0000 mg | ORAL_TABLET | Freq: Every day | ORAL | 3 refills | Status: DC

## 2019-12-28 MED ORDER — HYDRALAZINE HCL 50 MG PO TABS
50.0000 mg | ORAL_TABLET | Freq: Three times a day (TID) | ORAL | 3 refills | Status: DC
Start: 2019-12-28 — End: 2020-01-01

## 2019-12-28 NOTE — Progress Notes (Signed)
Cardiology Office Note:    Date:  12/28/2019   ID:  Shawn Sanders., DOB 04-Jul-1970, MRN [id number]  PCP:  Shawn Like, NP  Cardiologist:  Shawn Ripple, DO  Electrophysiologist:  None   Referring MD: Shawn Like, NP     History of Present Illness:    Shawn Sanders. is a 49 y.o. male with a hx of hypertension, obesity, presented on December 13, 2019 after his hospitalization for hypertensive urgency.  At the time of his initial visitation he did have significant bilateral leg edema I increase his Lasix to 40 mg twice a day for 2 weeks.  He also was hypertensive I added Aldactone to his medication regimen.  He is here today for follow-up visit. He is here today for follow-up he tells me he has been improving with the shortness of breath. His blood pressure today is elevated. He does not have any chest pain. He is here with his son   Past Medical History:  Diagnosis Date   Acute congestive heart failure (HCC) 11/20/2019   Acute diastolic CHF (congestive heart failure) (HCC) 11/21/2019   HTN (hypertension) 11/19/2019   Hypertension    Hypertensive emergency 11/20/2019   Obesity, Class III, BMI 40-49.9 (morbid obesity) (HCC) 11/21/2019   Tinea cruris 11/19/2019    Past Surgical History:  Procedure Laterality Date   NO PAST SURGERIES      Current Medications: Current Meds  Medication Sig   acetaminophen (TYLENOL) 500 MG tablet Take 500 mg by mouth every 6 (six) hours as needed for mild pain.   aspirin EC 81 MG tablet Take 1 tablet (81 mg total) by mouth daily. Swallow whole.   fluconazole (DIFLUCAN) 150 MG tablet Take 1 tablet (150 mg total) by mouth once a week.   furosemide (LASIX) 40 MG tablet Take 1 tablet (40 mg total) by mouth 2 (two) times daily.   spironolactone (ALDACTONE) 25 MG tablet Take 1 tablet (25 mg total) by mouth daily.   Vitamin D, Ergocalciferol, (DRISDOL) 1.25 MG (50000 UNIT) CAPS capsule Take 50,000 Units by mouth once a  week.   [DISCONTINUED] amLODipine (NORVASC) 10 MG tablet Take 1 tablet (10 mg total) by mouth daily.   [DISCONTINUED] hydrALAZINE (APRESOLINE) 50 MG tablet Take 50 mg by mouth every 8 (eight) hours.   [DISCONTINUED] losartan (COZAAR) 25 MG tablet Take 25 mg by mouth at bedtime.     Allergies:   Penicillins, Olive oil, Other, and Raspberry   Social History   Socioeconomic History   Marital status: Married    Spouse name: Not on file   Number of children: Not on file   Years of education: Not on file   Highest education level: Not on file  Occupational History   Not on file  Tobacco Use   Smoking status: Never Smoker   Smokeless tobacco: Never Used  Vaping Use   Vaping Use: Never used  Substance and Sexual Activity   Alcohol use: Never   Drug use: Never   Sexual activity: Not on file  Other Topics Concern   Not on file  Social History Narrative   Not on file   Social Determinants of Health   Financial Resource Strain: Not on file  Food Insecurity: Not on file  Transportation Needs: Not on file  Physical Activity: Not on file  Stress: Not on file  Social Connections: Not on file     Family History: The patient's family history includes Breast cancer in  his mother; Hypertension in his father.  ROS:   Review of Systems  Constitution: Negative for decreased appetite, fever and weight gain.  HENT: Negative for congestion, ear discharge, hoarse voice and sore throat.   Eyes: Negative for discharge, redness, vision loss in right eye and visual halos.  Cardiovascular: Negative for chest pain, dyspnea on exertion, leg swelling, orthopnea and palpitations.  Respiratory: Negative for cough, hemoptysis, shortness of breath and snoring.   Endocrine: Negative for heat intolerance and polyphagia.  Hematologic/Lymphatic: Negative for bleeding problem. Does not bruise/bleed easily.  Skin: Negative for flushing, nail changes, rash and suspicious lesions.   Musculoskeletal: Negative for arthritis, joint pain, muscle cramps, myalgias, neck pain and stiffness.  Gastrointestinal: Negative for abdominal pain, bowel incontinence, diarrhea and excessive appetite.  Genitourinary: Negative for decreased libido, genital sores and incomplete emptying.  Neurological: Negative for brief paralysis, focal weakness, headaches and loss of balance.  Psychiatric/Behavioral: Negative for altered mental status, depression and suicidal ideas.  Allergic/Immunologic: Negative for HIV exposure and persistent infections.    EKGs/Labs/Other Studies Reviewed:    The following studies were reviewed today:   EKG: None today   Transthoracic echocardiogramIMPRESSIONS  1. Left ventricular ejection fraction, by estimation, is 65 to 70%. The left ventricle has hyperdynamic function. The left ventricle has no regional wall motion abnormalities. There is moderate concentric left  ventricular hypertrophy. Left ventricular diastolic parameters are consistent with Grade II diastolic dysfunction  (pseudonormalization). Elevated left atrial pressure.  2. Right ventricular systolic function is normal. The right ventricular size is normal. Tricuspid regurgitation signal is inadequate for assessing PA pressure.  3. The mitral valve was not well visualized. No evidence of mitral valve regurgitation. No evidence of mitral stenosis.  4. The aortic valve was not well visualized. Aortic valve regurgitation is not visualized. No aortic stenosis is present.    Recent Labs: 11/18/2019: ALT 31; B Natriuretic Peptide 88.0 11/22/2019: Hemoglobin 15.8; Platelets 263 12/13/2019: BUN 15; Creatinine, Ser 0.91; Magnesium 2.0; NT-Pro BNP 115; Potassium 4.1; Sodium 137  Recent Lipid Panel No results found for: CHOL, TRIG, HDL, CHOLHDL, VLDL, LDLCALC, LDLDIRECT  Physical Exam:    VS:  BP (!) 150/80    Pulse 96    Ht 6\' 1"  (1.854 m)    Wt (!) 357 lb 3.2 oz (162 kg)    SpO2 95%    BMI 47.13  kg/m     Wt Readings from Last 3 Encounters:  12/28/19 (!) 357 lb 3.2 oz (162 kg)  12/13/19 (!) 364 lb 6.4 oz (165.3 kg)  11/22/19 (!) 355 lb 8 oz (161.3 kg)     GEN: Well nourished, well developed in no acute distress HEENT: Normal NECK: No JVD; No carotid bruits LYMPHATICS: No lymphadenopathy CARDIAC: S1S2 noted,RRR, no murmurs, rubs, gallops RESPIRATORY:  Clear to auscultation without rales, wheezing or rhonchi  ABDOMEN: Soft, non-tender, non-distended, +bowel sounds, no guarding. EXTREMITIES: No edema, No cyanosis, no clubbing MUSCULOSKELETAL:  No deformity  SKIN: Warm and dry NEUROLOGIC:  Alert and oriented x 3, non-focal PSYCHIATRIC:  Normal affect, good insight  ASSESSMENT:    1. Primary hypertension   2. Chronic diastolic heart failure (HCC)   3. Bilateral leg edema   4. Obesity, Class III, BMI 40-49.9 (morbid obesity) (HCC)    PLAN:     I was able to take his blood pressure manually and compare with his device at home and it really was the same. At home he brought his blood pressure readings usually in the  170s and 180s. We'll Sanders to increase his losartan to 50 mg twice a day.He will remain on Aldactone 25 mg daily, hydralazine 50 mg every 8 hours, amlodipine 10 mg daily. If ordered I reached goal of less than 130/80 we'll add carvedilol to his medication regimen.  The patient understands the need to lose weight with diet and exercise. We have discussed specific strategies for this.  He still have some bilateral leg edema but this is improving.  He plans to get his sleep study in the upcoming weeks.  The patient is in agreement with the above plan. The patient left the office in stable condition.  The patient will follow up in   Medication Adjustments/Labs and Tests Ordered: Current medicines are reviewed at length with the patient today.  Concerns regarding medicines are outlined above.  No orders of the defined types were placed in this encounter.  Meds  ordered this encounter  Medications   DISCONTD: losartan (COZAAR) 50 MG tablet    Sig: Take 1 tablet (50 mg total) by mouth daily.    Dispense:  90 tablet    Refill:  3   amLODipine (NORVASC) 10 MG tablet    Sig: Take 1 tablet (10 mg total) by mouth daily.    Dispense:  90 tablet    Refill:  3   hydrALAZINE (APRESOLINE) 50 MG tablet    Sig: Take 1 tablet (50 mg total) by mouth every 8 (eight) hours.    Dispense:  180 tablet    Refill:  3   losartan (COZAAR) 50 MG tablet    Sig: Take 1 tablet (50 mg total) by mouth in the morning and at bedtime.    Dispense:  90 tablet    Refill:  3    Patient Instructions  Medication Instructions:  Start Losartan 50 mg twice daily   *If you need a refill on your cardiac medications before your next appointment, please call your pharmacy*   Lab Work: None ordered   If you have labs (blood work) drawn today and your tests are completely normal, you will receive your results only by:  MyChart Message (if you have MyChart) OR  A paper copy in the mail If you have any lab test that is abnormal or we need to change your treatment, we will call you to review the results.   Testing/Procedures: None ordered    Follow-Up: At [facility], you and your health needs are our priority.  As part of our continuing mission to provide you with exceptional heart care, we have created designated Provider Care Teams.  These Care Teams include your primary Cardiologist (physician) and Advanced Practice Providers (APPs -  Physician Assistants and Nurse Practitioners) who all work together to provide you with the care you need, when you need it.  We recommend signing up for the patient portal called "MyChart".  Sign up information is provided on this After Visit Summary.  MyChart is used to connect with patients for Virtual Visits (Telemedicine).  Patients are able to view lab/test results, encounter notes, upcoming appointments, etc.  Non-urgent messages  can be sent to your provider as well.   To learn more about what you can do with MyChart, go to [email or web address].    Your next appointment:   6 week(s)  The format for your next appointment:   In Person  Provider:   Thomasene Ripple, DO   Other Instructions None      Adopting a Healthy Lifestyle.  Know  what a healthy weight is for you (roughly BMI <25) and aim to maintain this   Aim for 7+ servings of fruits and vegetables daily   65-80+ fluid ounces of water or unsweet tea for healthy kidneys   Limit to max 1 drink of alcohol per day; avoid smoking/tobacco   Limit animal fats in diet for cholesterol and heart health - choose grass fed whenever available   Avoid highly processed foods, and foods high in saturated/trans fats   Aim for low stress - take time to unwind and care for your mental health   Aim for 150 min of moderate intensity exercise weekly for heart health, and weights twice weekly for bone health   Aim for 7-9 hours of sleep daily   When it comes to diets, agreement about the perfect plan isnt easy to find, even among the experts. Experts at the [facility] [employer] developed an idea known as the Healthy Eating Plate. Just imagine a plate divided into logical, healthy portions.   The emphasis is on diet quality:   Load up on vegetables and fruits - one-half of your plate: Aim for color and variety, and remember that potatoes dont count.   Go for whole grains - one-quarter of your plate: Whole wheat, barley, wheat berries, quinoa, oats, brown rice, and foods made with them. If you want pasta, go with whole wheat pasta.   Protein power - one-quarter of your plate: Fish, chicken, beans, and nuts are all healthy, versatile protein sources. Limit red meat.   The diet, however, does go beyond the plate, offering a few other suggestions.   Use healthy plant oils, such as olive, canola, soy, corn, sunflower and peanut. Check the labels, and  avoid partially hydrogenated oil, which have unhealthy trans fats.   If youre thirsty, drink water. Coffee and tea are good in moderation, but skip sugary drinks and limit milk and dairy products to one or two daily servings.   The type of carbohydrate in the diet is more important than the amount. Some sources of carbohydrates, such as vegetables, fruits, whole grains, and beans-are healthier than others.   Finally, stay active  Signed, Shawn Ripple, DO  12/28/2019 3:46 PM    [facility]

## 2019-12-28 NOTE — Addendum Note (Signed)
Addended by: Vernard Gambles on: 12/28/2019 04:39 PM   Modules accepted: Orders

## 2019-12-28 NOTE — Patient Instructions (Addendum)
Medication Instructions:  Start Losartan 50 mg twice daily   *If you need a refill on your cardiac medications before your next appointment, please call your pharmacy*   Lab Work: None ordered   If you have labs (blood work) drawn today and your tests are completely normal, you will receive your results only by:  MyChart Message (if you have MyChart) OR  A paper copy in the mail If you have any lab test that is abnormal or we need to change your treatment, we will call you to review the results.   Testing/Procedures: None ordered    Follow-Up: At [facility], you and your health needs are our priority.  As part of our continuing mission to provide you with exceptional heart care, we have created designated Provider Care Teams.  These Care Teams include your primary Cardiologist (physician) and Advanced Practice Providers (APPs -  Physician Assistants and Nurse Practitioners) who all work together to provide you with the care you need, when you need it.  We recommend signing up for the patient portal called "MyChart".  Sign up information is provided on this After Visit Summary.  MyChart is used to connect with patients for Virtual Visits (Telemedicine).  Patients are able to view lab/test results, encounter notes, upcoming appointments, etc.  Non-urgent messages can be sent to your provider as well.   To learn more about what you can do with MyChart, go to [email or web address].    Your next appointment:   6 week(s)  The format for your next appointment:   In Person  Provider:   Thomasene Ripple, DO   Other Instructions None

## 2019-12-31 ENCOUNTER — Encounter (HOSPITAL_COMMUNITY): Payer: Self-pay | Admitting: *Deleted

## 2019-12-31 ENCOUNTER — Emergency Department (HOSPITAL_COMMUNITY): Payer: PRIVATE HEALTH INSURANCE

## 2019-12-31 ENCOUNTER — Other Ambulatory Visit: Payer: Self-pay

## 2019-12-31 ENCOUNTER — Emergency Department (HOSPITAL_COMMUNITY)
Admission: EM | Admit: 2019-12-31 | Discharge: 2019-12-31 | Disposition: A | Discharge status: Home / Self Care | Payer: PRIVATE HEALTH INSURANCE | Attending: Emergency Medicine | Admitting: Emergency Medicine

## 2019-12-31 DIAGNOSIS — Z79899 Other long term (current) drug therapy: Secondary | ICD-10-CM | POA: Diagnosis not present

## 2019-12-31 DIAGNOSIS — Z7982 Long term (current) use of aspirin: Secondary | ICD-10-CM | POA: Diagnosis not present

## 2019-12-31 DIAGNOSIS — I110 Hypertensive heart disease with heart failure: Secondary | ICD-10-CM | POA: Insufficient documentation

## 2019-12-31 DIAGNOSIS — S29019A Strain of muscle and tendon of unspecified wall of thorax, initial encounter: Secondary | ICD-10-CM

## 2019-12-31 DIAGNOSIS — I5033 Acute on chronic diastolic (congestive) heart failure: Secondary | ICD-10-CM | POA: Insufficient documentation

## 2019-12-31 DIAGNOSIS — X58XXXA Exposure to other specified factors, initial encounter: Secondary | ICD-10-CM | POA: Diagnosis not present

## 2019-12-31 DIAGNOSIS — S299XXA Unspecified injury of thorax, initial encounter: Secondary | ICD-10-CM | POA: Diagnosis present

## 2019-12-31 DIAGNOSIS — S29012A Strain of muscle and tendon of back wall of thorax, initial encounter: Secondary | ICD-10-CM | POA: Diagnosis not present

## 2019-12-31 LAB — BASIC METABOLIC PANEL
Anion gap: 11 (ref 5–15)
BUN: 23 mg/dL — ABNORMAL HIGH (ref 6–20)
CO2: 28 mmol/L (ref 22–32)
Calcium: 9.9 mg/dL (ref 8.9–10.3)
Chloride: 98 mmol/L (ref 98–111)
Creatinine, Ser: 1.33 mg/dL — ABNORMAL HIGH (ref 0.61–1.24)
GFR, Estimated: >60 mL/min (ref >60)
Glucose, Bld: 137 mg/dL — ABNORMAL HIGH (ref 70–99)
Potassium: 4 mmol/L (ref 3.5–5.1)
Sodium: 137 mmol/L (ref 135–145)

## 2019-12-31 LAB — BRAIN NATRIURETIC PEPTIDE: B Natriuretic Peptide: 13.4 pg/mL (ref 0.0–100.0)

## 2019-12-31 LAB — TROPONIN I (HIGH SENSITIVITY)
Troponin I (High Sensitivity): 16 ng/L (ref <18)
Troponin I (High Sensitivity): 14 ng/L (ref <18)

## 2019-12-31 LAB — CBC
HCT: 47.4 % (ref 39.0–52.0)
Hemoglobin: 15.4 g/dL (ref 13.0–17.0)
MCH: 28.5 pg (ref 26.0–34.0)
MCHC: 32.5 g/dL (ref 30.0–36.0)
MCV: 87.8 fL (ref 80.0–100.0)
Platelets: 281 10*3/uL (ref 150–400)
RBC: 5.4 MIL/uL (ref 4.22–5.81)
RDW: 13.2 % (ref 11.5–15.5)
WBC: 10.4 10*3/uL (ref 4.0–10.5)
nRBC: 0 % (ref 0.0–0.2)

## 2019-12-31 LAB — D-DIMER, QUANTITATIVE: D-Dimer, Quant: 0.54 ug/mL-FEU — ABNORMAL HIGH (ref 0.00–0.50)

## 2019-12-31 MED ORDER — IOHEXOL 350 MG/ML SOLN
80.0000 mL | Freq: Once | INTRAVENOUS | Status: AC | PRN
  Administered 2019-12-31: 06:00:00 80 mL via INTRAVENOUS

## 2019-12-31 MED ORDER — METHOCARBAMOL 500 MG PO TABS: 500.0000 mg | ORAL_TABLET | Freq: Three times a day (TID) | ORAL | 0 refills | Status: DC | PRN

## 2019-12-31 MED ORDER — HYDROMORPHONE HCL 1 MG/ML IJ SOLN
1.0000 mg | Freq: Once | INTRAMUSCULAR | Status: DC
  Filled 2019-12-31: qty 1

## 2019-12-31 MED ORDER — ONDANSETRON 4 MG PO TBDP
8.0000 mg | ORAL_TABLET | Freq: Once | ORAL | Status: DC
  Filled 2019-12-31: qty 2

## 2019-12-31 MED ORDER — HYDROCODONE-ACETAMINOPHEN 5-325 MG PO TABS
1.0000 | ORAL_TABLET | ORAL | 0 refills | Status: DC | PRN
Start: 2019-12-31 — End: 2021-01-15

## 2019-12-31 NOTE — ED Provider Notes (Signed)
[facility] EMERGENCY DEPARTMENT Provider Note   CSN: [id number] Arrival date & time: 12/31/19  0054     History Chief Complaint  Patient presents with  . Back Pain  . Shortness of Breath    Shawn Sanders. is a 49 y.o. male.  Patient presents to the emergency department for evaluation of left-sided mid back pain started earlier today.  Patient reports that the pain is sharp and significantly worsens if he takes a deep breath.  He reports that the pain is severe enough to cause him to not be able to breathe deeply.  He does not feel particularly short of breath.  He has not had any recent illness, cough or congestion.  Denies unilateral leg swelling but does have chronic bilateral leg swelling for which he takes Lasix.  No history of blood clots.  No recent surgery.  No recent long distance travel.        Past Medical History:  Diagnosis Date  . Acute congestive heart failure (HCC) 11/20/2019  . Acute diastolic CHF (congestive heart failure) (HCC) 11/21/2019  . HTN (hypertension) 11/19/2019  . Hypertension   . Hypertensive emergency 11/20/2019  . 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 Tinea cruris 11/19/2019    Patient Active Problem List   Diagnosis Date Noted  . SOB (shortness of breath) 12/14/2019  . Bilateral leg edema 12/14/2019  . Chronic diastolic heart failure (HCC) 12/14/2019  . Varicose veins of both legs with edema 12/14/2019  . Hypertension   . Acute diastolic CHF (congestive heart failure) (HCC) 11/21/2019  . 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 Hypertensive emergency 11/20/2019  . Acute congestive heart failure (HCC) 11/20/2019  . HTN (hypertension) 11/19/2019  . Tinea cruris 11/19/2019    Past Surgical History:  Procedure Laterality Date  . NO PAST SURGERIES         Family History  Problem Relation Age of Onset  . Breast cancer Mother   . Hypertension  Father     Social History   Tobacco Use  . Smoking status: Never Smoker  . Smokeless tobacco: Never Used  Vaping Use  . Vaping Use: Never used  Substance Use Topics  . Alcohol use: Never  . Drug use: Never    Home Medications Prior to Admission medications   Medication Sig Start Date End Date Taking? Authorizing Provider  acetaminophen (TYLENOL) 500 MG tablet Take 500 mg by mouth every 6 (six) hours as needed for mild pain.    [provider]  amLODipine (NORVASC) 10 MG tablet Take 1 tablet (10 mg total) by mouth daily. 12/28/19   Tobb, Kardie, DO  aspirin EC 81 MG tablet Take 1 tablet (81 mg total) by mouth daily. Swallow whole. 12/17/19 12/16/20  Tobb, Kardie, DO  fluconazole (DIFLUCAN) 150 MG tablet Take 1 tablet (150 mg total) by mouth once a week. 11/26/19   Laverna Peace, MD  furosemide (LASIX) 40 MG tablet Take 1 tablet (40 mg total) by mouth 2 (two) times daily. 12/17/19   Tobb, Kardie, DO  hydrALAZINE (APRESOLINE) 50 MG tablet Take 1 tablet (50 mg total) by mouth every 8 (eight) hours. 12/28/19   Tobb, Kardie, DO  HYDROcodone-acetaminophen (NORCO/VICODIN) 5-325 MG tablet Take 1 tablet by mouth every 4 (four) hours as needed for moderate pain. 12/31/19   Gilda Crease, MD  losartan (COZAAR) 50 MG tablet Take 1 tablet (50 mg total) by mouth in  the morning and at bedtime. 12/28/19   Tobb, Kardie, DO  methocarbamol (ROBAXIN) 500 MG tablet Take 1 tablet (500 mg total) by mouth every 8 (eight) hours as needed for muscle spasms. 12/31/19   Gilda Crease, MD  spironolactone (ALDACTONE) 25 MG tablet Take 1 tablet (25 mg total) by mouth daily. 12/13/19   Tobb, Kardie, DO  Vitamin D, Ergocalciferol, (DRISDOL) 1.25 MG (50000 UNIT) CAPS capsule Take 50,000 Units by mouth once a week. 12/03/19   [provider]    Allergies    Penicillins, Olive oil, Other, and Raspberry  Review of Systems   Review of Systems  Musculoskeletal: Positive for back pain.   All other systems reviewed and are negative.   Physical Exam Updated Vital Signs BP 137/73   Pulse 85   Temp 99.8 F (37.7 C) (Oral)   Resp (!) 21   SpO2 94%   Physical Exam Vitals and nursing note reviewed.  Constitutional:      General: He is not in acute distress.    Appearance: Normal appearance. He is well-developed and well-nourished.  HENT:     Head: Normocephalic and atraumatic.     Right Ear: Hearing normal.     Left Ear: Hearing normal.     Nose: Nose normal.     Mouth/Throat:     Mouth: Oropharynx is clear and moist and mucous membranes are normal.  Eyes:     Extraocular Movements: EOM normal.     Conjunctiva/sclera: Conjunctivae normal.     Pupils: Pupils are equal, round, and reactive to light.  Cardiovascular:     Rate and Rhythm: Regular rhythm.     Heart sounds: S1 normal and S2 normal. No murmur heard. No friction rub. No gallop.   Pulmonary:     Effort: Pulmonary effort is normal. No respiratory distress.     Breath sounds: Normal breath sounds.  Chest:     Chest wall: No tenderness.  Abdominal:     General: Bowel sounds are normal.     Palpations: Abdomen is soft. There is no hepatosplenomegaly.     Tenderness: There is no abdominal tenderness. There is no guarding or rebound. Negative signs include Murphy's sign and McBurney's sign.     Hernia: No hernia is present.  Musculoskeletal:        General: Normal range of motion.     Cervical back: Normal range of motion and neck supple.     Right lower leg: Edema present.     Left lower leg: Edema present.  Skin:    General: Skin is warm, dry and intact.     Findings: No rash.     Nails: There is no cyanosis.  Neurological:     Mental Status: He is alert and oriented to person, place, and time.     GCS: GCS eye subscore is 4. GCS verbal subscore is 5. GCS motor subscore is 6.     Cranial Nerves: No cranial nerve deficit.     Sensory: No sensory deficit.     Coordination: Coordination normal.      Deep Tendon Reflexes: Strength normal.  Psychiatric:        Mood and Affect: Mood and affect normal.        Speech: Speech normal.        Behavior: Behavior normal.        Thought Content: Thought content normal.     ED Results / Procedures / Treatments   Labs (all labs  ordered are listed, but only abnormal results are displayed) Labs Reviewed  BASIC METABOLIC PANEL - Abnormal; Notable for the following components:      Result Value   Glucose, Bld 137 (*)    BUN 23 (*)    Creatinine, Ser 1.33 (*)    All other components within normal limits  D-DIMER, QUANTITATIVE (NOT AT [facility]) - Abnormal; Notable for the following components:   D-Dimer, Quant 0.54 (*)    All other components within normal limits  CBC  BRAIN NATRIURETIC PEPTIDE  TROPONIN I (HIGH SENSITIVITY)  TROPONIN I (HIGH SENSITIVITY)    EKG EKG Interpretation  Date/Time:  Monday December 31 2019 01:50:18 EST Ventricular Rate:  90 PR Interval:  154 QRS Duration: 96 QT Interval:  344 QTC Calculation: 420 R Axis:   28 Text Interpretation: Normal sinus rhythm Normal ECG Confirmed by Gilda Crease [phone]) on 12/31/2019 3:54:35 AM   Radiology DG Chest 2 View  Result Date: 12/31/2019 CLINICAL DATA:  Left chest pain, shortness of breath EXAM: CHEST - 2 VIEW COMPARISON:  Radiograph 11/18/2019 FINDINGS: Enlarged cardiac silhouette is similar to prior. Mild vascular congestion, central cuffing and hazy interstitial opacity in the lungs. Suspect some basilar atelectatic changes as well. Stable calcified nodule in the right lung base, likely small granuloma. No consolidative opacity. No pneumothorax. Stable mild pleural thickening, could reflect some chronic subpleural fat given body habitus. No clear pneumothorax or effusion. No acute osseous or soft tissue abnormality. Degenerative changes are present in the imaged spine and shoulders. IMPRESSION: Mild vascular congestion, central cuffing and hazy interstitial opacity  in the lungs, suspicious for mild interstitial edema on a background of atelectatic change. Electronically Signed   By: Kreg Shropshire M.D.   On: 12/31/2019 02:47   CT ANGIO CHEST PE W OR WO CONTRAST  Result Date: 12/31/2019 CLINICAL DATA:  Positive D-dimer with back pain. EXAM: CT ANGIOGRAPHY CHEST WITH CONTRAST TECHNIQUE: Multidetector CT imaging of the chest was performed using the standard protocol during bolus administration of intravenous contrast. Multiplanar CT image reconstructions and MIPs were obtained to evaluate the vascular anatomy. CONTRAST:  78mL OMNIPAQUE IOHEXOL 350 MG/ML SOLN COMPARISON:  None. FINDINGS: Cardiovascular: Suboptimal pulmonary artery opacification due to bolus dispersion and body habitus, but satisfactory to the level of the proximal segmental vessels. No evidence of pulmonary embolism. Normal heart size. No pericardial effusion. Mediastinum/Nodes: Negative for adenopathy or mass. Mediastinal fat hypertrophy. Lungs/Pleura: Hazy opacity in the subpleural left lower lobe with probable volume loss. The opacities most consistent with atelectasis and partially affects multiple segments. Trace left pleural effusion. No edema or pneumothorax Upper Abdomen: Negative Musculoskeletal: Degenerative endplate spurring. Review of the MIP images confirms the above findings. IMPRESSION: 1. Suboptimal but satisfactory pulmonary artery opacification to the segmental level. No evidence of pulmonary embolism. 2. Left lower lobe atelectasis with trace pleural fluid. Electronically Signed   By: Marnee Spring M.D.   On: 12/31/2019 06:29    Procedures Procedures (including critical care time)  Medications Ordered in ED Medications  HYDROmorphone (DILAUDID) injection 1 mg (0 mg Intramuscular Hold 12/31/19 0623)  ondansetron (ZOFRAN-ODT) disintegrating tablet 8 mg (0 mg Oral Hold 12/31/19 0623)  iohexol (OMNIPAQUE) 350 MG/ML injection 80 mL (80 mLs Intravenous Contrast Given 12/31/19 0615)     ED Course  I have reviewed the triage vital signs and the nursing notes.  Pertinent labs & imaging results that were available during my care of the patient were reviewed by me and considered in my  medical decision making (see chart for details).    MDM Rules/Calculators/A&P                          Patient presents to the emergency department for evaluation of left mid back pain.  Patient reports that the pain is sharp and has been continuous.  He has noticed that the pain significantly worsens if he takes a deep breath.  He does not have any specific DVT/PE risk factors.  No anterior chest pain.  Cardiac evaluation unremarkable.  D-dimer was slightly elevated.  Patient underwent CT angiography that did not show any evidence of PE.  Patient reports that he is a [employer] and did some very strenuous work yesterday, might have hurt himself at that time.  Will treat with analgesia and rest, follow-up with PCP.  Final Clinical Impression(s) / ED Diagnoses Final diagnoses:  Thoracic myofascial strain, initial encounter    Rx / DC Orders ED Discharge Orders         Ordered    HYDROcodone-acetaminophen (NORCO/VICODIN) 5-325 MG tablet  Every 4 hours PRN        12/31/19 0659    methocarbamol (ROBAXIN) 500 MG tablet  Every 8 hours PRN        12/31/19 0659           Gilda CreasePollina, Jamine Highfill J, MD 12/31/19 [phone]

## 2019-12-31 NOTE — ED Notes (Signed)
Pt to xray

## 2020-01-01 ENCOUNTER — Ambulatory Visit (INDEPENDENT_AMBULATORY_CARE_PROVIDER_SITE_OTHER): Payer: PRIVATE HEALTH INSURANCE | Admitting: Cardiology

## 2020-01-01 ENCOUNTER — Encounter: Payer: Self-pay | Admitting: Cardiology

## 2020-01-01 VITALS — BP 134/88 | HR 90 | Ht 73.0 in | Wt 360.0 lb

## 2020-01-01 DIAGNOSIS — R091 Pleurisy: Secondary | ICD-10-CM

## 2020-01-01 DIAGNOSIS — I10 Essential (primary) hypertension: Secondary | ICD-10-CM

## 2020-01-01 DIAGNOSIS — I5032 Chronic diastolic (congestive) heart failure: Secondary | ICD-10-CM | POA: Diagnosis not present

## 2020-01-01 NOTE — Patient Instructions (Signed)
Medication Instructions:  Your physician has recommended you make the following change in your medication:  STOP HYDRALAZINE  *If you need a refill on your cardiac medications before your next appointment, please call your pharmacy*   Lab Work:Your physician recommends that you return for lab work TODAY: ESR AND CRP  Testing/Procedures: NONE   Follow-Up: At [facility], you and your health needs are our priority.  As part of our continuing mission to provide you with exceptional heart care, we have created designated Provider Care Teams.  These Care Teams include your primary Cardiologist (physician) and Advanced Practice Providers (APPs -  Physician Assistants and Nurse Practitioners) who all work together to provide you with the care you need, when you need it.  We recommend signing up for the patient portal called "MyChart".  Sign up information is provided on this After Visit Summary.  MyChart is used to connect with patients for Virtual Visits (Telemedicine).  Patients are able to view lab/test results, encounter notes, upcoming appointments, etc.  Non-urgent messages can be sent to your provider as well.   To learn more about what you can do with MyChart, go to [email or web address].    Your next appointment:   2 week(s)  The format for your next appointment:   In Person  Provider:   Berniece Salines, DO

## 2020-01-01 NOTE — Progress Notes (Signed)
Cardiology Office Note:    Date:  01/01/2020   ID:  Shawn Life., DOB 1970/06/29, MRN [id number]  PCP:  Earlyne Iba, NP  Cardiologist:  Berniece Salines, DO  Electrophysiologist:  None   Referring MD: Earlyne Iba, NP  I am having muscle pain and pain when I breathe.  History of Present Illness:    Shawn Burbano. is a 49 y.o. male with a hx of hx of hypertension, obesity, presented on December 13, 2019 after his hospitalization for hypertensive urgency.  At the time of his initial visitation he did have significant bilateral leg edema I increase his Lasix to 40 mg twice a day for 2 weeks.  He also was hypertensive I added Aldactone to his medication regimen.  I saw the patient on December 28, 2019 at that time he was still hypertensive so I increase his losartan to 50 mg twice a day, he will remain on his Aldactone 25 mg daily, hydrochlorothiazide 50 mg every 8 hours, amlodipine 10 mg daily he was also on Lasix 40 mg twice a day.  Since I last saw the patient he has visited the emergency department due to back pain and painful breathing.  His ED visit was concerned about PE he had a CTA which did not show any PE but showed evidence of left lower lobe atelectasis with trace pleural effusion.  His chest x-ray was normal. He comes to the office today he says he still is experiencing the back pain with breathing.  He is concerned that hydralazine may be causing the symptoms for him.  He has not tried any of the muscle relaxants as he did not want to take this medication.  Past Medical History:  Diagnosis Date  . Acute congestive heart failure ([place]) 11/20/2019  . Acute diastolic CHF (congestive heart failure) ([place]) 11/21/2019  . HTN (hypertension) 11/19/2019  . Hypertension   . Hypertensive emergency 11/20/2019  . Obesity, Class III, BMI 40-49.9 (morbid obesity) ([place]) 11/21/2019  . Tinea cruris 11/19/2019    Past Surgical History:  Procedure Laterality Date  . NO PAST  SURGERIES      Current Medications: Current Meds  Medication Sig  . acetaminophen (TYLENOL) 500 MG tablet Take 500 mg by mouth every 6 (six) hours as needed for mild pain.  Marland Kitchen amLODipine (NORVASC) 10 MG tablet Take 1 tablet (10 mg total) by mouth daily.  Marland Kitchen aspirin EC 81 MG tablet Take 1 tablet (81 mg total) by mouth daily. Swallow whole.  . fluconazole (DIFLUCAN) 150 MG tablet Take 1 tablet (150 mg total) by mouth once a week.  . furosemide (LASIX) 40 MG tablet Take 1 tablet (40 mg total) by mouth 2 (two) times daily.  Marland Kitchen HYDROcodone-acetaminophen (NORCO/VICODIN) 5-325 MG tablet Take 1 tablet by mouth every 4 (four) hours as needed for moderate pain.  Marland Kitchen losartan (COZAAR) 100 MG tablet 100 mg.  . losartan (COZAAR) 50 MG tablet Take 1 tablet (50 mg total) by mouth in the morning and at bedtime.  . methocarbamol (ROBAXIN) 500 MG tablet Take 1 tablet (500 mg total) by mouth every 8 (eight) hours as needed for muscle spasms.  Marland Kitchen spironolactone (ALDACTONE) 25 MG tablet Take 1 tablet (25 mg total) by mouth daily.  . Vitamin D, Ergocalciferol, (DRISDOL) 1.25 MG (50000 UNIT) CAPS capsule Take 50,000 Units by mouth once a week.  . [DISCONTINUED] hydrALAZINE (APRESOLINE) 50 MG tablet Take 1 tablet (50 mg total) by mouth every 8 (eight) hours.  Allergies:   Penicillins, Olive oil, Other, and Raspberry   Social History   Socioeconomic History  . Marital status: Married    Spouse name: Not on file  . Number of children: Not on file  . Years of education: Not on file  . Highest education level: Not on file  Occupational History  . Not on file  Tobacco Use  . Smoking status: Never Smoker  . Smokeless tobacco: Never Used  Vaping Use  . Vaping Use: Never used  Substance and Sexual Activity  . Alcohol use: Never  . Drug use: Never  . Sexual activity: Not on file  Other Topics Concern  . Not on file  Social History Narrative  . Not on file   Social Determinants of Health   Financial  Resource Strain: Not on file  Food Insecurity: Not on file  Transportation Needs: Not on file  Physical Activity: Not on file  Stress: Not on file  Social Connections: Not on file     Family History: The patient's family history includes Breast cancer in his mother; Hypertension in his father.  ROS:   Review of Systems  Constitution: Negative for decreased appetite, fever and weight gain.  HENT: Negative for congestion, ear discharge, hoarse voice and sore throat.   Eyes: Negative for discharge, redness, vision loss in right eye and visual halos.  Cardiovascular: Negative for chest pain, dyspnea on exertion, leg swelling, orthopnea and palpitations.  Respiratory: Negative for cough, hemoptysis, shortness of breath and snoring.   Endocrine: Negative for heat intolerance and polyphagia.  Hematologic/Lymphatic: Negative for bleeding problem. Does not bruise/bleed easily.  Skin: Negative for flushing, nail changes, rash and suspicious lesions.  Musculoskeletal: Negative for arthritis, joint pain, muscle cramps, myalgias, neck pain and stiffness.  Gastrointestinal: Negative for abdominal pain, bowel incontinence, diarrhea and excessive appetite.  Genitourinary: Negative for decreased libido, genital sores and incomplete emptying.  Neurological: Negative for brief paralysis, focal weakness, headaches and loss of balance.  Psychiatric/Behavioral: Negative for altered mental status, depression and suicidal ideas.  Allergic/Immunologic: Negative for HIV exposure and persistent infections.    EKGs/Labs/Other Studies Reviewed:    The following studies were reviewed today:   EKG: None today   Transthoracic echocardiogramIMPRESSIONS  1. Left ventricular ejection fraction, by estimation, is 65 to 70%. The left ventricle has hyperdynamic function. The left ventricle has no regional wall motion abnormalities. There is moderate concentric left  ventricular hypertrophy. Left ventricular  diastolic parameters are consistent with Grade II diastolic dysfunction  (pseudonormalization). Elevated left atrial pressure.  2. Right ventricular systolic function is normal. The right ventricular size is normal. Tricuspid regurgitation signal is inadequate for assessing PA pressure.  3. The mitral valve was not well visualized. No evidence of mitral valve regurgitation. No evidence of mitral stenosis.  4. The aortic valve was not well visualized. Aortic valve regurgitation is not visualized. No aortic stenosis is present.  Cardiac CTA. FINDINGS: Cardiovascular: Suboptimal pulmonary artery opacification due to bolus dispersion and body habitus, but satisfactory to the level of the proximal segmental vessels. No evidence of pulmonary embolism. Normal heart size. No pericardial effusion.  Mediastinum/Nodes: Negative for adenopathy or mass. Mediastinal fat hypertrophy.  Lungs/Pleura: Hazy opacity in the subpleural left lower lobe with probable volume loss. The opacities most consistent with atelectasis and partially affects multiple segments. Trace left pleural effusion. No edema or pneumothorax  Upper Abdomen: Negative  Musculoskeletal: Degenerative endplate spurring.  Review of the MIP images confirms the above findings.  IMPRESSION:  1. Suboptimal but satisfactory pulmonary artery opacification to the segmental level. No evidence of pulmonary embolism. 2. Left lower lobe atelectasis with trace pleural fluid.  Recent Labs: 11/18/2019: ALT 31 12/13/2019: Magnesium 2.0; NT-Pro BNP 115 12/31/2019: B Natriuretic Peptide 13.4; BUN 23; Creatinine, Ser 1.33; Hemoglobin 15.4; Platelets 281; Potassium 4.0; Sodium 137  Recent Lipid Panel No results found for: CHOL, TRIG, HDL, CHOLHDL, VLDL, LDLCALC, LDLDIRECT  Physical Exam:    VS:  BP 134/88   Pulse 90   Ht '6\' 1"'  (1.854 m)   Wt (!) 360 lb (163.3 kg)   SpO2 94%   BMI 47.50 kg/m     Wt Readings from Last 3 Encounters:   01/01/20 (!) 360 lb (163.3 kg)  12/31/19 (!) 357 lb (161.9 kg)  12/28/19 (!) 357 lb 3.2 oz (162 kg)     GEN: Well nourished, well developed in no acute distress HEENT: Normal NECK: No JVD; No carotid bruits LYMPHATICS: No lymphadenopathy CARDIAC: S1S2 noted,RRR, no murmurs, rubs, gallops RESPIRATORY:  Clear to auscultation without rales, wheezing or rhonchi  ABDOMEN: Soft, non-tender, non-distended, +bowel sounds, no guarding. EXTREMITIES: No edema, No cyanosis, no clubbing MUSCULOSKELETAL:  No deformity  SKIN: Warm and dry NEUROLOGIC:  Alert and oriented x 3, non-focal PSYCHIATRIC:  Normal affect, good insight  ASSESSMENT:    1. Pleurisy   2. Primary hypertension   3. Chronic diastolic heart failure (HCC)    PLAN:    The patient is concerned that hydralazine may be causing his breathing problem, we will stop the hydralazine today.  We will continue to monitor the patient blood pressure.  I do suspect this may be musculoskeletal I also asked the patient that he would need to take the muscle relaxants or get a local anesthetic like Biofreeze to help with symptom relief.  I also will get ESR and CRP to make sure that his inflammatory markers were not elevated in the setting of his pleuritic pain.  He will continue all the other medications with no change.  Take his blood pressure daily.  Plan to see the patient in 2 weeks.  The patient is in agreement with the above plan. The patient left the office in stable condition.  The patient will follow up in 2 weeks or sooner if needed.   Medication Adjustments/Labs and Tests Ordered: Current medicines are reviewed at length with the patient today.  Concerns regarding medicines are outlined above.  Orders Placed This Encounter  Procedures  . C-reactive protein  . Sed Rate (ESR)   No orders of the defined types were placed in this encounter.   Patient Instructions  Medication Instructions:  Your physician has recommended you  make the following change in your medication:  STOP HYDRALAZINE  *If you need a refill on your cardiac medications before your next appointment, please call your pharmacy*   Lab Work:Your physician recommends that you return for lab work TODAY: ESR AND CRP  Testing/Procedures: NONE   Follow-Up: At [facility], you and your health needs are our priority.  As part of our continuing mission to provide you with exceptional heart care, we have created designated Provider Care Teams.  These Care Teams include your primary Cardiologist (physician) and Advanced Practice Providers (APPs -  Physician Assistants and Nurse Practitioners) who all work together to provide you with the care you need, when you need it.  We recommend signing up for the patient portal called "MyChart".  Sign up information is provided on this After Visit  Summary.  MyChart is used to connect with patients for Virtual Visits (Telemedicine).  Patients are able to view lab/test results, encounter notes, upcoming appointments, etc.  Non-urgent messages can be sent to your provider as well.   To learn more about what you can do with MyChart, go to [email or web address].    Your next appointment:   2 week(s)  The format for your next appointment:   In Person  Provider:   Berniece Salines, DO        Adopting a Healthy Lifestyle.  Know what a healthy weight is for you (roughly BMI <25) and aim to maintain this   Aim for 7+ servings of fruits and vegetables daily   65-80+ fluid ounces of water or unsweet tea for healthy kidneys   Limit to max 1 drink of alcohol per day; avoid smoking/tobacco   Limit animal fats in diet for cholesterol and heart health - choose grass fed whenever available   Avoid highly processed foods, and foods high in saturated/trans fats   Aim for low stress - take time to unwind and care for your mental health   Aim for 150 min of moderate intensity exercise weekly for heart health, and  weights twice weekly for bone health   Aim for 7-9 hours of sleep daily   When it comes to diets, agreement about the perfect plan isnt easy to find, even among the experts. Experts at the [employer] developed an idea known as the Healthy Eating Plate. Just imagine a plate divided into logical, healthy portions.   The emphasis is on diet quality:   Load up on vegetables and fruits - one-half of your plate: Aim for color and variety, and remember that potatoes dont count.   Go for whole grains - one-quarter of your plate: Whole wheat, barley, wheat berries, quinoa, oats, brown rice, and foods made with them. If you want pasta, go with whole wheat pasta.   Protein power - one-quarter of your plate: Fish, chicken, beans, and nuts are all healthy, versatile protein sources. Limit red meat.   The diet, however, does go beyond the plate, offering a few other suggestions.   Use healthy plant oils, such as olive, canola, soy, corn, sunflower and peanut. Check the labels, and avoid partially hydrogenated oil, which have unhealthy trans fats.   If youre thirsty, drink water. Coffee and tea are good in moderation, but skip sugary drinks and limit milk and dairy products to one or two daily servings.   The type of carbohydrate in the diet is more important than the amount. Some sources of carbohydrates, such as vegetables, fruits, whole grains, and beans-are healthier than others.   Finally, stay active  Signed, Berniece Salines, DO  01/01/2020 1:09 PM    [place]

## 2020-01-02 LAB — SEDIMENTATION RATE: Sed Rate: 23 mm/hr (ref 0–30)

## 2020-01-02 LAB — C-REACTIVE PROTEIN: CRP: 77 mg/L — ABNORMAL HIGH (ref 0–10)

## 2020-01-02 NOTE — Telephone Encounter (Signed)
Patient returning call.

## 2020-01-02 NOTE — Telephone Encounter (Signed)
Attempted to call patient no answer and voicemail full will continue efforts.

## 2020-01-02 NOTE — Telephone Encounter (Signed)
Spoke with patient about test results already. See result note.

## 2020-01-02 NOTE — Telephone Encounter (Signed)
Patient called back. He reports that since his emergency visit he has been in a lot of pain. He was given hydrocodone 5-325 mg and it has been helping. He only has two left and wants to know if Dr. Servando Salina could give him a few more to get him through the weekend. I advised him that I would check with her and let him know.

## 2020-01-14 ENCOUNTER — Encounter: Payer: Self-pay | Admitting: Cardiology

## 2020-01-14 ENCOUNTER — Ambulatory Visit (INDEPENDENT_AMBULATORY_CARE_PROVIDER_SITE_OTHER): Payer: PRIVATE HEALTH INSURANCE | Admitting: Cardiology

## 2020-01-14 ENCOUNTER — Other Ambulatory Visit: Payer: Self-pay

## 2020-01-14 ENCOUNTER — Telehealth: Payer: Self-pay

## 2020-01-14 VITALS — BP 142/82 | HR 84 | Ht 73.0 in | Wt 351.4 lb

## 2020-01-14 DIAGNOSIS — I5032 Chronic diastolic (congestive) heart failure: Secondary | ICD-10-CM

## 2020-01-14 DIAGNOSIS — I10 Essential (primary) hypertension: Secondary | ICD-10-CM

## 2020-01-14 MED ORDER — FUROSEMIDE 40 MG PO TABS: 40.0000 mg | ORAL_TABLET | Freq: Two times a day (BID) | ORAL | 3 refills | Status: DC

## 2020-01-14 NOTE — Patient Instructions (Signed)
Medication Instructions:  Refill of Furosemide sent to pharmacy [facility] for patients PCP to prescribe medication for Erectile Dysfunction.  Avoid taking Nitroglycerin within 48 hours of taking Viagra and within 72 hours of taking Cialis. *If you need a refill on your cardiac medications before your next appointment, please call your pharmacy*   Lab Work: None If you have labs (blood work) drawn today and your tests are completely normal, you will receive your results only by: Marland Kitchen MyChart Message (if you have MyChart) OR . A paper copy in the mail If you have any lab test that is abnormal or we need to change your treatment, we will call you to review the results.   Testing/Procedures: None   Follow-Up: At [facility], you and your health needs are our priority.  As part of our continuing mission to provide you with exceptional heart care, we have created designated Provider Care Teams.  These Care Teams include your primary Cardiologist (physician) and Advanced Practice Providers (APPs -  Physician Assistants and Nurse Practitioners) who all work together to provide you with the care you need, when you need it.  We recommend signing up for the patient portal called "MyChart".  Sign up information is provided on this After Visit Summary.  MyChart is used to connect with patients for Virtual Visits (Telemedicine).  Patients are able to view lab/test results, encounter notes, upcoming appointments, etc.  Non-urgent messages can be sent to your provider as well.   To learn more about what you can do with MyChart, go to [email or web address].    Your next appointment:   3 month(s)  The format for your next appointment:   In Person  Provider:   Thomasene Ripple, DO   Other Instructions

## 2020-01-14 NOTE — Telephone Encounter (Signed)
Spoke with patient about his labs Wenda Low, NP recently done per Dr. Servando Salina, Patient verbalized understanding and is willing do repeat labs at next appointment with Dr. Servando Salina.   Creatinine improved, talked about vitamin D and patient agreed on repeating lab work at next visit.

## 2020-01-14 NOTE — Progress Notes (Signed)
Cardiology Office Note:    Date:  01/14/2020   ID:  Shawn Sanders., DOB Nov 18, 1970, MRN [id number]  PCP:  Earlyne Iba, NP  Cardiologist:  Berniece Salines, DO  Electrophysiologist:  None   Referring MD: Earlyne Iba, NP   " I am doing well"  History of Present Illness:    Shawn Sanders. is a 50 y.o. male with a hx of of hx of hypertension, obesity, presented on December 13, 2019 after his hospitalization for hypertensive urgency. At the time of his initial visitation he did have significant bilateral leg edema I increase his Lasix to 40 mg twice a day for 2 weeks. He also was hypertensive I added Aldactone to his medication regimen.  I saw the patient on December 28, 2019 at that time he was still hypertensive so I increase his losartan to 50 mg twice a day, he will remain on his Aldactone 25 mg daily, hydrochlorothiazide 50 mg every 8 hours, amlodipine 10 mg daily he was also on Lasix 40 mg twice a day.   I saw the patient on 01/01/2020 at that time he had had an ED visit due to chest pain CTA ruled out PE.  He was concerned that his hydralazine may be causing this.  We stopped his hydralazine as well as got lab work for ESR and CRP.  He tells me since he stopped the hydralazine his symptoms had completely resolved.  Past Medical History:  Diagnosis Date  . Acute congestive heart failure ([name]) 11/20/2019  . Acute diastolic CHF (congestive heart failure) ([name]) 11/21/2019  . HTN (hypertension) 11/19/2019  . Hypertension   . Hypertensive emergency 11/20/2019  . Obesity, Class III, BMI 40-49.9 (morbid obesity) ([place]) 11/21/2019  . Tinea cruris 11/19/2019    Past Surgical History:  Procedure Laterality Date  . NO PAST SURGERIES      Current Medications: Current Meds  Medication Sig  . acetaminophen (TYLENOL) 500 MG tablet Take 500 mg by mouth every 6 (six) hours as needed for mild pain.  Marland Kitchen amLODipine (NORVASC) 10 MG tablet Take 1 tablet (10 mg total) by mouth  daily.  Marland Kitchen aspirin EC 81 MG tablet Take 1 tablet (81 mg total) by mouth daily. Swallow whole.  . fluconazole (DIFLUCAN) 150 MG tablet Take 1 tablet (150 mg total) by mouth once a week.  . furosemide (LASIX) 40 MG tablet Take 1 tablet (40 mg total) by mouth 2 (two) times daily.  Marland Kitchen HYDROcodone-acetaminophen (NORCO/VICODIN) 5-325 MG tablet Take 1 tablet by mouth every 4 (four) hours as needed for moderate pain.  Marland Kitchen losartan (COZAAR) 100 MG tablet 100 mg.  . losartan (COZAAR) 50 MG tablet Take 1 tablet (50 mg total) by mouth in the morning and at bedtime.  . methocarbamol (ROBAXIN) 500 MG tablet Take 1 tablet (500 mg total) by mouth every 8 (eight) hours as needed for muscle spasms.  Marland Kitchen spironolactone (ALDACTONE) 25 MG tablet Take 1 tablet (25 mg total) by mouth daily.  . Vitamin D, Ergocalciferol, (DRISDOL) 1.25 MG (50000 UNIT) CAPS capsule Take 50,000 Units by mouth once a week.  . [DISCONTINUED] furosemide (LASIX) 40 MG tablet Take 1 tablet (40 mg total) by mouth 2 (two) times daily.     Allergies:   Penicillins, Olive oil, Other, and Raspberry   Social History   Socioeconomic History  . Marital status: Divorced    Spouse name: Not on file  . Number of children: Not on file  . Years  of education: Not on file  . Highest education level: Not on file  Occupational History  . Not on file  Tobacco Use  . Smoking status: Never Smoker  . Smokeless tobacco: Never Used  Vaping Use  . Vaping Use: Never used  Substance and Sexual Activity  . Alcohol use: Never  . Drug use: Never  . Sexual activity: Not on file  Other Topics Concern  . Not on file  Social History Narrative  . Not on file   Social Determinants of Health   Financial Resource Strain: Not on file  Food Insecurity: Not on file  Transportation Needs: Not on file  Physical Activity: Not on file  Stress: Not on file  Social Connections: Not on file     Family History: The patient's family history includes Breast cancer in  his mother; Hypertension in his father.  ROS:   Review of Systems  Constitution: Negative for decreased appetite, fever and weight gain.  HENT: Negative for congestion, ear discharge, hoarse voice and sore throat.   Eyes: Negative for discharge, redness, vision loss in right eye and visual halos.  Cardiovascular: Negative for chest pain, dyspnea on exertion, leg swelling, orthopnea and palpitations.  Respiratory: Negative for cough, hemoptysis, shortness of breath and snoring.   Endocrine: Negative for heat intolerance and polyphagia.  Hematologic/Lymphatic: Negative for bleeding problem. Does not bruise/bleed easily.  Skin: Negative for flushing, nail changes, rash and suspicious lesions.  Musculoskeletal: Negative for arthritis, joint pain, muscle cramps, myalgias, neck pain and stiffness.  Gastrointestinal: Negative for abdominal pain, bowel incontinence, diarrhea and excessive appetite.  Genitourinary: Negative for decreased libido, genital sores and incomplete emptying.  Neurological: Negative for brief paralysis, focal weakness, headaches and loss of balance.  Psychiatric/Behavioral: Negative for altered mental status, depression and suicidal ideas.  Allergic/Immunologic: Negative for HIV exposure and persistent infections.    EKGs/Labs/Other Studies Reviewed:    The following studies were reviewed today:   EKG: None today  Transthoracic echocardiogramIMPRESSIONS  1. Left ventricular ejection fraction, by estimation, is 65 to 70%. The left ventricle has hyperdynamic function. The left ventricle has no regional wall motion abnormalities. There is moderate concentric left  ventricular hypertrophy. Left ventricular diastolic parameters are consistent with Grade II diastolic dysfunction  (pseudonormalization). Elevated left atrial pressure.  2. Right ventricular systolic function is normal. The right ventricular size is normal. Tricuspid regurgitation signal is inadequate for  assessing PA pressure.  3. The mitral valve was not well visualized. No evidence of mitral valve regurgitation. No evidence of mitral stenosis.  4. The aortic valve was not well visualized. Aortic valve regurgitation is not visualized. No aortic stenosis is present.  Cardiac CTA. FINDINGS: Cardiovascular: Suboptimal pulmonary artery opacification due to bolus dispersion and body habitus, but satisfactory to the level of the proximal segmental vessels. No evidence of pulmonary embolism. Normal heart size. No pericardial effusion.  Mediastinum/Nodes: Negative for adenopathy or mass. Mediastinal fat hypertrophy.  Lungs/Pleura: Hazy opacity in the subpleural left lower lobe with probable volume loss. The opacities most consistent with atelectasis and partially affects multiple segments. Trace left pleural effusion. No edema or pneumothorax  Upper Abdomen: Negative  Musculoskeletal: Degenerative endplate spurring.  Review of the MIP images confirms the above findings.  IMPRESSION: 1. Suboptimal but satisfactory pulmonary artery opacification to the segmental level. No evidence of pulmonary embolism. 2. Left lower lobe atelectasis with trace pleural fluid  Recent Labs: 11/18/2019: ALT 31 12/13/2019: Magnesium 2.0; NT-Pro BNP 115 12/31/2019: B Natriuretic Peptide 13.4;  BUN 23; Creatinine, Ser 1.33; Hemoglobin 15.4; Platelets 281; Potassium 4.0; Sodium 137  Recent Lipid Panel No results found for: CHOL, TRIG, HDL, CHOLHDL, VLDL, LDLCALC, LDLDIRECT  Physical Exam:    VS:  BP (!) 142/82   Pulse 84   Ht '6\' 1"'  (1.854 m)   Wt (!) 351 lb 6.4 oz (159.4 kg)   SpO2 95%   BMI 46.36 kg/m     Wt Readings from Last 3 Encounters:  01/14/20 (!) 351 lb 6.4 oz (159.4 kg)  01/01/20 (!) 360 lb (163.3 kg)  12/31/19 (!) 357 lb (161.9 kg)     GEN: Well nourished, well developed in no acute distress HEENT: Normal NECK: No JVD; No carotid bruits LYMPHATICS: No lymphadenopathy CARDIAC:  S1S2 noted,RRR, no murmurs, rubs, gallops RESPIRATORY:  Clear to auscultation without rales, wheezing or rhonchi  ABDOMEN: Soft, non-tender, non-distended, +bowel sounds, no guarding. EXTREMITIES: No edema, No cyanosis, no clubbing MUSCULOSKELETAL:  No deformity  SKIN: Warm and dry NEUROLOGIC:  Alert and oriented x 3, non-focal PSYCHIATRIC:  Normal affect, good insight  ASSESSMENT:    1. Primary hypertension   2. Obesity, Class III, BMI 40-49.9 (morbid obesity) ([place])   3. Chronic diastolic heart failure (HCC)    PLAN:     His blood pressure acceptable we will continue this for now.  He needs refill on his Lasix encounter right for this refill.  He will continue his current antihypertensive regimen which includes amlodipine, losartan, Aldactone and Lasix. He also did have questions about the use of Viagra and Cialis his PCP had deferred this to me.  In terms of use of these medications for his erectile dysfunction is okay.  Have educated the patient about abstaining from nitroglycerin for these 48 hours if he used Viagra in 72 hours if you Cialis.  The patient understands the need to lose weight with diet and exercise. We have discussed specific strategies for this.  The patient is in agreement with the above plan. The patient left the office in stable condition.  The patient will follow up in 3 months.  Medication Adjustments/Labs and Tests Ordered: Current medicines are reviewed at length with the patient today.  Concerns regarding medicines are outlined above.  No orders of the defined types were placed in this encounter.  Meds ordered this encounter  Medications  . furosemide (LASIX) 40 MG tablet    Sig: Take 1 tablet (40 mg total) by mouth 2 (two) times daily.    Dispense:  180 tablet    Refill:  3    Patient Instructions  Medication Instructions:  Refill of Furosemide sent to pharmacy [facility] for patients PCP to prescribe medication for Erectile Dysfunction.  Avoid taking  Nitroglycerin within 48 hours of taking Viagra and within 72 hours of taking Cialis. *If you need a refill on your cardiac medications before your next appointment, please call your pharmacy*   Lab Work: None If you have labs (blood work) drawn today and your tests are completely normal, you will receive your results only by: Marland Kitchen MyChart Message (if you have MyChart) OR . A paper copy in the mail If you have any lab test that is abnormal or we need to change your treatment, we will call you to review the results.   Testing/Procedures: None   Follow-Up: At [facility], you and your health needs are our priority.  As part of our continuing mission to provide you with exceptional heart care, we have created designated Provider Care Teams.  These Care Teams include your primary Cardiologist (physician) and Advanced Practice Providers (APPs -  Physician Assistants and Nurse Practitioners) who all work together to provide you with the care you need, when you need it.  We recommend signing up for the patient portal called "MyChart".  Sign up information is provided on this After Visit Summary.  MyChart is used to connect with patients for Virtual Visits (Telemedicine).  Patients are able to view lab/test results, encounter notes, upcoming appointments, etc.  Non-urgent messages can be sent to your provider as well.   To learn more about what you can do with MyChart, go to [email or web address].    Your next appointment:   3 month(s)  The format for your next appointment:   In Person  Provider:   Berniece Salines, DO   Other Instructions      Adopting a Healthy Lifestyle.  Know what a healthy weight is for you (roughly BMI <25) and aim to maintain this   Aim for 7+ servings of fruits and vegetables daily   65-80+ fluid ounces of water or unsweet tea for healthy kidneys   Limit to max 1 drink of alcohol per day; avoid smoking/tobacco   Limit animal fats in diet for cholesterol and  heart health - choose grass fed whenever available   Avoid highly processed foods, and foods high in saturated/trans fats   Aim for low stress - take time to unwind and care for your mental health   Aim for 150 min of moderate intensity exercise weekly for heart health, and weights twice weekly for bone health   Aim for 7-9 hours of sleep daily   When it comes to diets, agreement about the perfect plan isnt easy to find, even among the experts. Experts at the [place] developed an idea known as the Healthy Eating Plate. Just imagine a plate divided into logical, healthy portions.   The emphasis is on diet quality:   Load up on vegetables and fruits - one-half of your plate: Aim for color and variety, and remember that potatoes dont count.   Go for whole grains - one-quarter of your plate: Whole wheat, barley, wheat berries, quinoa, oats, brown rice, and foods made with them. If you want pasta, go with whole wheat pasta.   Protein power - one-quarter of your plate: Fish, chicken, beans, and nuts are all healthy, versatile protein sources. Limit red meat.   The diet, however, does go beyond the plate, offering a few other suggestions.   Use healthy plant oils, such as olive, canola, soy, corn, sunflower and peanut. Check the labels, and avoid partially hydrogenated oil, which have unhealthy trans fats.   If youre thirsty, drink water. Coffee and tea are good in moderation, but skip sugary drinks and limit milk and dairy products to one or two daily servings.   The type of carbohydrate in the diet is more important than the amount. Some sources of carbohydrates, such as vegetables, fruits, whole grains, and beans-are healthier than others.   Finally, stay active  Signed, Berniece Salines, DO  01/14/2020 1:35 PM    [facility]

## 2020-02-08 ENCOUNTER — Ambulatory Visit: Payer: PRIVATE HEALTH INSURANCE | Admitting: Cardiology

## 2020-04-14 ENCOUNTER — Ambulatory Visit (INDEPENDENT_AMBULATORY_CARE_PROVIDER_SITE_OTHER): Payer: PRIVATE HEALTH INSURANCE | Admitting: Cardiology

## 2020-04-14 ENCOUNTER — Other Ambulatory Visit: Payer: Self-pay

## 2020-04-14 ENCOUNTER — Encounter: Payer: Self-pay | Admitting: Cardiology

## 2020-04-14 VITALS — BP 142/86 | HR 88 | Ht 73.0 in | Wt 364.6 lb

## 2020-04-14 DIAGNOSIS — E559 Vitamin D deficiency, unspecified: Secondary | ICD-10-CM | POA: Diagnosis not present

## 2020-04-14 DIAGNOSIS — R5383 Other fatigue: Secondary | ICD-10-CM | POA: Diagnosis not present

## 2020-04-14 DIAGNOSIS — I10 Essential (primary) hypertension: Secondary | ICD-10-CM | POA: Diagnosis not present

## 2020-04-14 DIAGNOSIS — E8881 Metabolic syndrome: Secondary | ICD-10-CM | POA: Diagnosis not present

## 2020-04-14 DIAGNOSIS — I5032 Chronic diastolic (congestive) heart failure: Secondary | ICD-10-CM

## 2020-04-14 NOTE — Patient Instructions (Signed)
Medication Instructions:  Your physician recommends that you continue on your current medications as directed. Please refer to the Current Medication list given to you today.  *If you need a refill on your cardiac medications before your next appointment, please call your pharmacy*   Lab Work: Your physician recommends that you return for lab work: TODAY: Vitamin D, Testosterone level, HbgA1C If you have labs (blood work) drawn today and your tests are completely normal, you will receive your results only by: Marland Kitchen MyChart Message (if you have MyChart) OR . A paper copy in the mail If you have any lab test that is abnormal or we need to change your treatment, we will call you to review the results.   Testing/Procedures: None   Follow-Up: At [facility], you and your health needs are our priority.  As part of our continuing mission to provide you with exceptional heart care, we have created designated Provider Care Teams.  These Care Teams include your primary Cardiologist (physician) and Advanced Practice Providers (APPs -  Physician Assistants and Nurse Practitioners) who all work together to provide you with the care you need, when you need it.  We recommend signing up for the patient portal called "MyChart".  Sign up information is provided on this After Visit Summary.  MyChart is used to connect with patients for Virtual Visits (Telemedicine).  Patients are able to view lab/test results, encounter notes, upcoming appointments, etc.  Non-urgent messages can be sent to your provider as well.   To learn more about what you can do with MyChart, go to [email or web address].    Your next appointment:   6 month(s)  The format for your next appointment:   In Person  Provider:   Thomasene Ripple, DO   Other Instructions

## 2020-04-14 NOTE — Progress Notes (Signed)
Cardiology Office Note:    Date:  04/14/2020   ID:  Shawn Life., DOB May 30, 1970, MRN [id number]  PCP:  Earlyne Iba, NP  Cardiologist:  Berniece Salines, DO  Electrophysiologist:  None   Referring MD: Earlyne Iba, NP   I am doing fine   History of Present Illness:    Shawn Innes. is a 50 y.o. male with a hx of  hypertension, obesity, presented on December 13, 2019 after his hospitalization for hypertensive urgency. At the time of his initial visitation he did have significant bilateral leg edema I increase his Lasix to 40 mg twice a day for 2 weeks. He also was hypertensive I added Aldactone to his medication regimen.  I saw the patient on December 28, 2019 at that time he was still hypertensive so I increase his losartan to 50 mg twice a day, he will remain on his Aldactone 25 mg daily, hydrochlorothiazide 50 mg every 8 hours, amlodipine 10 mg daily he was also on Lasix 40 mg twice a day.   I saw the patient on 01/01/2020 at that time he had had an ED visit due to chest pain CTA ruled out PE.  He was concerned that his hydralazine may be causing this.  We stopped his hydralazine as well as got lab work for ESR and CRP.     At his last visit in January we will continue the patient on his blood pressure medication.  And he also had questions about use of phosphodiesterase inhibitors I did educate the patient about how to use this effectively with any nitroglycerin.  He expressed understanding at the time. We kept the patient at a systolic of 748 because if he drops below 130 mm Hg he gets light headed.  He is here today for follow-up visit. He has some problems with his erectile dysfunction and feels as if his current dose of generic sildenafil is not helping. No chest pain, no shortness of breath, no lightheadedness and dizziness.  Past Medical History:  Diagnosis Date  . Acute congestive heart failure ([place]) 11/20/2019  . Acute diastolic CHF (congestive heart  failure) ([place]) 11/21/2019  . HTN (hypertension) 11/19/2019  . Hypertension   . Hypertensive emergency 11/20/2019  . Obesity, Class III, BMI 40-49.9 (morbid obesity) ([place]) 11/21/2019  . Tinea cruris 11/19/2019    Past Surgical History:  Procedure Laterality Date  . NO PAST SURGERIES      Current Medications: Current Meds  Medication Sig  . acetaminophen (TYLENOL) 500 MG tablet Take 500 mg by mouth every 6 (six) hours as needed for mild pain.  Marland Kitchen amLODipine (NORVASC) 10 MG tablet Take 1 tablet (10 mg total) by mouth daily.  Marland Kitchen aspirin EC 81 MG tablet Take 1 tablet (81 mg total) by mouth daily. Swallow whole.  . furosemide (LASIX) 40 MG tablet Take 1 tablet (40 mg total) by mouth 2 (two) times daily.  Marland Kitchen HYDROcodone-acetaminophen (NORCO/VICODIN) 5-325 MG tablet Take 1 tablet by mouth every 4 (four) hours as needed for moderate pain.  Marland Kitchen losartan (COZAAR) 100 MG tablet Take 50 mg by mouth 2 (two) times daily.  . methocarbamol (ROBAXIN) 500 MG tablet Take 1 tablet (500 mg total) by mouth every 8 (eight) hours as needed for muscle spasms.  Marland Kitchen spironolactone (ALDACTONE) 25 MG tablet Take 1 tablet (25 mg total) by mouth daily.  . Vitamin D, Ergocalciferol, (DRISDOL) 1.25 MG (50000 UNIT) CAPS capsule Take 50,000 Units by mouth once a week.  Allergies:   Penicillins, Hydralazine, Olive oil, Other, and Raspberry   Social History   Socioeconomic History  . Marital status: Divorced    Spouse name: Not on file  . Number of children: Not on file  . Years of education: Not on file  . Highest education level: Not on file  Occupational History  . Not on file  Tobacco Use  . Smoking status: Never Smoker  . Smokeless tobacco: Never Used  Vaping Use  . Vaping Use: Never used  Substance and Sexual Activity  . Alcohol use: Never  . Drug use: Never  . Sexual activity: Not on file  Other Topics Concern  . Not on file  Social History Narrative  . Not on file   Social Determinants of Health    Financial Resource Strain: Not on file  Food Insecurity: Not on file  Transportation Needs: Not on file  Physical Activity: Not on file  Stress: Not on file  Social Connections: Not on file     Family History: The patient's family history includes Breast cancer in his mother; Hypertension in his father.  ROS:   Review of Systems  Constitution: Negative for decreased appetite, fever and weight gain.  HENT: Negative for congestion, ear discharge, hoarse voice and sore throat.   Eyes: Negative for discharge, redness, vision loss in right eye and visual halos.  Cardiovascular: Negative for chest pain, dyspnea on exertion, leg swelling, orthopnea and palpitations.  Respiratory: Negative for cough, hemoptysis, shortness of breath and snoring.   Endocrine: Negative for heat intolerance and polyphagia.  Hematologic/Lymphatic: Negative for bleeding problem. Does not bruise/bleed easily.  Skin: Negative for flushing, nail changes, rash and suspicious lesions.  Musculoskeletal: Negative for arthritis, joint pain, muscle cramps, myalgias, neck pain and stiffness.  Gastrointestinal: Negative for abdominal pain, bowel incontinence, diarrhea and excessive appetite.  Genitourinary: Negative for decreased libido, genital sores and incomplete emptying.  Neurological: Negative for brief paralysis, focal weakness, headaches and loss of balance.  Psychiatric/Behavioral: Negative for altered mental status, depression and suicidal ideas.  Allergic/Immunologic: Negative for HIV exposure and persistent infections.    EKGs/Labs/Other Studies Reviewed:    The following studies were reviewed today:   EKG:  None today  Transthoracic echocardiogramIMPRESSIONS November 2021 1. Left ventricular ejection fraction, by estimation, is 65 to 70%. The left ventricle has hyperdynamic function. The left ventricle has no regional wall motion abnormalities. There is moderate concentric left  ventricular  hypertrophy. Left ventricular diastolic parameters are consistent with Grade II diastolic dysfunction  (pseudonormalization). Elevated left atrial pressure.  2. Right ventricular systolic function is normal. The right ventricular size is normal. Tricuspid regurgitation signal is inadequate for assessing PA pressure.  3. The mitral valve was not well visualized. No evidence of mitral valve regurgitation. No evidence of mitral stenosis.  4. The aortic valve was not well visualized. Aortic valve regurgitation is not visualized. No aortic stenosis is present.  CTA chest done in December 2021 FINDINGS: Cardiovascular: Suboptimal pulmonary artery opacification due to bolus dispersion and body habitus, but satisfactory to the level of the proximal segmental vessels. No evidence of pulmonary embolism. Normal heart size. No pericardial effusion.  Mediastinum/Nodes: Negative for adenopathy or mass. Mediastinal fat hypertrophy.  Lungs/Pleura: Hazy opacity in the subpleural left lower lobe with probable volume loss. The opacities most consistent with atelectasis and partially affects multiple segments. Trace left pleural effusion. No edema or pneumothorax  Upper Abdomen: Negative  Musculoskeletal: Degenerative endplate spurring.  Review of the MIP images  confirms the above findings.  IMPRESSION: 1. Suboptimal but satisfactory pulmonary artery opacification to the segmental level. No evidence of pulmonary embolism. 2. Left lower lobe atelectasis with trace pleural fluid  Recent Labs: 11/18/2019: ALT 31 12/13/2019: Magnesium 2.0; NT-Pro BNP 115 12/31/2019: B Natriuretic Peptide 13.4; BUN 23; Creatinine, Ser 1.33; Hemoglobin 15.4; Platelets 281; Potassium 4.0; Sodium 137  Recent Lipid Panel No results found for: CHOL, TRIG, HDL, CHOLHDL, VLDL, LDLCALC, LDLDIRECT  Physical Exam:    VS:  BP (!) 142/86   Pulse 88   Ht _0  (1.854 m)   Wt (!) 364 lb 9.6 oz (165.4 kg)   SpO2 96%    BMI 48.10 kg/m     Wt Readings from Last 3 Encounters:  04/14/20 (!) 364 lb 9.6 oz (165.4 kg)  01/14/20 (!) 351 lb 6.4 oz (159.4 kg)  01/01/20 (!) 360 lb (163.3 kg)     GEN: Well nourished, well developed in no acute distress HEENT: Normal NECK: No JVD; No carotid bruits LYMPHATICS: No lymphadenopathy CARDIAC: S1S2 noted,RRR, no murmurs, rubs, gallops RESPIRATORY:  Clear to auscultation without rales, wheezing or rhonchi  ABDOMEN: Soft, non-tender, non-distended, +bowel sounds, no guarding. EXTREMITIES: No edema, No cyanosis, no clubbing MUSCULOSKELETAL:  No deformity  SKIN: Warm and dry NEUROLOGIC:  Alert and oriented x 3, non-focal PSYCHIATRIC:  Normal affect, good insight  ASSESSMENT:    1. Fatigue, unspecified type   2. Vitamin D deficiency   3. Metabolic syndrome   4. Primary hypertension   5. Chronic diastolic heart failure (HCC)   6. Obesity, Class III, BMI 40-49.9 (morbid obesity) ([place])    PLAN:    His blood pressure is acceptable at sys 140 because anything below 130 causes dizziness. He will remain on the current medication dose.   He is not happy that his erectile dysfunction is not well treated. I did speak with the patient's pcp and he will need to follow up - I did tell patient about this.   Encourage increased exercise  Will get repeat vitamin D level  The patient is in agreement with the above plan. The patient left the office in stable condition.  The patient will follow up in 6 months or sooner if needed.   Medication Adjustments/Labs and Tests Ordered: Current medicines are reviewed at length with the patient today.  Concerns regarding medicines are outlined above.  Orders Placed This Encounter  Procedures  . Testosterone  . VITAMIN D 25 Hydroxy (Vit-D Deficiency, Fractures)  . Hemoglobin A1c   No orders of the defined types were placed in this encounter.   Patient Instructions  Medication Instructions:  Your physician recommends that you  continue on your current medications as directed. Please refer to the Current Medication list given to you today.  *If you need a refill on your cardiac medications before your next appointment, please call your pharmacy*   Lab Work: Your physician recommends that you return for lab work: TODAY: Vitamin D, Testosterone level, HbgA1C If you have labs (blood work) drawn today and your tests are completely normal, you will receive your results only by: Marland Kitchen MyChart Message (if you have MyChart) OR . A paper copy in the mail If you have any lab test that is abnormal or we need to change your treatment, we will call you to review the results.   Testing/Procedures: None   Follow-Up: At [facility], you and your health needs are our priority.  As part of our continuing mission to provide  you with exceptional heart care, we have created designated Provider Care Teams.  These Care Teams include your primary Cardiologist (physician) and Advanced Practice Providers (APPs -  Physician Assistants and Nurse Practitioners) who all work together to provide you with the care you need, when you need it.  We recommend signing up for the patient portal called "MyChart".  Sign up information is provided on this After Visit Summary.  MyChart is used to connect with patients for Virtual Visits (Telemedicine).  Patients are able to view lab/test results, encounter notes, upcoming appointments, etc.  Non-urgent messages can be sent to your provider as well.   To learn more about what you can do with MyChart, go to [email or web address].    Your next appointment:   6 month(s)  The format for your next appointment:   In Person  Provider:   Berniece Salines, DO   Other Instructions      Adopting a Healthy Lifestyle.  Know what a healthy weight is for you (roughly BMI <25) and aim to maintain this   Aim for 7+ servings of fruits and vegetables daily   65-80+ fluid ounces of water or unsweet tea for  healthy kidneys   Limit to max 1 drink of alcohol per day; avoid smoking/tobacco   Limit animal fats in diet for cholesterol and heart health - choose grass fed whenever available   Avoid highly processed foods, and foods high in saturated/trans fats   Aim for low stress - take time to unwind and care for your mental health   Aim for 150 min of moderate intensity exercise weekly for heart health, and weights twice weekly for bone health   Aim for 7-9 hours of sleep daily   When it comes to diets, agreement about the perfect plan isnt easy to find, even among the experts. Experts at the [place] developed an idea known as the Healthy Eating Plate. Just imagine a plate divided into logical, healthy portions.   The emphasis is on diet quality:   Load up on vegetables and fruits - one-half of your plate: Aim for color and variety, and remember that potatoes dont count.   Go for whole grains - one-quarter of your plate: Whole wheat, barley, wheat berries, quinoa, oats, brown rice, and foods made with them. If you want pasta, go with whole wheat pasta.   Protein power - one-quarter of your plate: Fish, chicken, beans, and nuts are all healthy, versatile protein sources. Limit red meat.   The diet, however, does go beyond the plate, offering a few other suggestions.   Use healthy plant oils, such as olive, canola, soy, corn, sunflower and peanut. Check the labels, and avoid partially hydrogenated oil, which have unhealthy trans fats.   If youre thirsty, drink water. Coffee and tea are good in moderation, but skip sugary drinks and limit milk and dairy products to one or two daily servings.   The type of carbohydrate in the diet is more important than the amount. Some sources of carbohydrates, such as vegetables, fruits, whole grains, and beans-are healthier than others.   Finally, stay active  Signed, Berniece Salines, DO  04/14/2020 3:51 PM    [facility]

## 2020-04-15 LAB — HEMOGLOBIN A1C
Est. average glucose Bld gHb Est-mCnc: 126 mg/dL
Hgb A1c MFr Bld: 6 % — ABNORMAL HIGH (ref 4.8–5.6)

## 2020-04-15 LAB — TESTOSTERONE: Testosterone: 338 ng/dL (ref 264–916)

## 2020-04-15 LAB — VITAMIN D 25 HYDROXY (VIT D DEFICIENCY, FRACTURES): Vit D, 25-Hydroxy: 22.1 ng/mL — ABNORMAL LOW (ref 30.0–100.0)

## 2020-04-17 ENCOUNTER — Telehealth: Payer: Self-pay

## 2020-04-17 ENCOUNTER — Telehealth: Payer: Self-pay | Admitting: Cardiology

## 2020-04-17 NOTE — Telephone Encounter (Signed)
-----   Message from Thomasene Ripple, DO sent at 04/16/2020  8:19 AM EDT ----- Your testosterone is normal.  Your vitamin D levels are still low are you taking the 50,000 units daily?  Your hemoglobin A1c is 6.0 you are prediabetic.

## 2020-04-17 NOTE — Telephone Encounter (Signed)
Pt is returning phone call from yesterday, pt is unsure of who called him because the caller did not leave their name per pt. Please advise

## 2020-04-17 NOTE — Telephone Encounter (Signed)
I spoke with this patient in regards to his lab results just now.

## 2020-04-17 NOTE — Telephone Encounter (Signed)
Spoke with patient regarding results and recommendation.  Patient verbalizes understanding and is agreeable to plan of care. Advised patient to call back with any issues or concerns.  

## 2020-12-13 ENCOUNTER — Other Ambulatory Visit: Payer: Self-pay | Admitting: Cardiology

## 2020-12-17 ENCOUNTER — Other Ambulatory Visit: Payer: Self-pay | Admitting: Cardiology

## 2020-12-19 ENCOUNTER — Telehealth: Payer: Self-pay

## 2020-12-19 ENCOUNTER — Other Ambulatory Visit: Payer: Self-pay

## 2020-12-19 ENCOUNTER — Encounter: Payer: Self-pay | Admitting: Cardiology

## 2020-12-19 MED ORDER — AMLODIPINE BESYLATE 10 MG PO TABS: ORAL_TABLET | Freq: Every day | ORAL | 0 refills | Status: DC

## 2020-12-19 NOTE — Telephone Encounter (Signed)
Refill sent to pharmacy.   

## 2020-12-19 NOTE — Telephone Encounter (Signed)
Spoke with patient, see chart.    

## 2020-12-19 NOTE — Telephone Encounter (Signed)
Called pt to set up a follow-up with Dr. Servando Salina. He is over due for the 6 month return. No voicemail, unable to leave message.

## 2021-01-15 ENCOUNTER — Encounter: Payer: Self-pay | Admitting: Cardiology

## 2021-01-15 ENCOUNTER — Other Ambulatory Visit: Payer: Self-pay

## 2021-01-15 ENCOUNTER — Ambulatory Visit (INDEPENDENT_AMBULATORY_CARE_PROVIDER_SITE_OTHER): Payer: No Typology Code available for payment source | Admitting: Cardiology

## 2021-01-15 VITALS — BP 142/74 | HR 87 | Ht 73.0 in | Wt 381.8 lb

## 2021-01-15 DIAGNOSIS — I5032 Chronic diastolic (congestive) heart failure: Secondary | ICD-10-CM

## 2021-01-15 DIAGNOSIS — I10 Essential (primary) hypertension: Secondary | ICD-10-CM

## 2021-01-15 MED ORDER — FUROSEMIDE 40 MG PO TABS: 40.0000 mg | ORAL_TABLET | Freq: Two times a day (BID) | ORAL | 3 refills | Status: AC

## 2021-01-15 MED ORDER — LOSARTAN POTASSIUM 100 MG PO TABS: 50.0000 mg | ORAL_TABLET | Freq: Two times a day (BID) | ORAL | 3 refills | Status: AC

## 2021-01-15 MED ORDER — SPIRONOLACTONE 25 MG PO TABS: 25.0000 mg | ORAL_TABLET | Freq: Every day | ORAL | 3 refills | Status: AC

## 2021-01-15 MED ORDER — AMLODIPINE BESYLATE 10 MG PO TABS: ORAL_TABLET | Freq: Every day | ORAL | 0 refills | Status: DC

## 2021-01-15 NOTE — Patient Instructions (Signed)

## 2021-01-15 NOTE — Progress Notes (Signed)
Cardiology Office Note:    Date:  01/15/2021   ID:  Shawn Life., DOB 1970/05/01, MRN [id number]  PCP:  Christ Kick, MD  Cardiologist:  Shawn Salines, Shawn Sanders  Electrophysiologist:  None   Referring MD: Earlyne Iba, NP   " I am doing well"  History of Present Illness:    Shawn Navarez. is a 51 y.o. male with a hx of hypertension, obesity, presented on December 13, 2019 after his hospitalization for hypertensive urgency.  At the time of his initial visitation he did have significant bilateral leg edema I increase his Lasix to 40 mg twice a day for 2 weeks.  He also was hypertensive I added Aldactone to his medication regimen.   I saw the patient on December 28, 2019 at that time he was still hypertensive so I increase his losartan to 50 mg twice a day, he will remain on his Aldactone 25 mg daily, hydrochlorothiazide 50 mg every 8 hours, amlodipine 10 mg daily he was also on Lasix 40 mg twice a day.   I saw the patient on 01/01/2020 at that time he had had an ED visit due to chest pain CTA ruled out PE.  He was concerned that his hydralazine may be causing this.  We stopped his hydralazine as well as got lab work for ESR and CRP.  At his visit in January we will continue the patient on his blood pressure medication.  And he also had questions about use of phosphodiesterase inhibitors I did educate the patient about how to use this effectively with any nitroglycerin.  He expressed understanding at the time. We kept the patient at a systolic of 741 because if he drops below 130 mm Hg he gets light headed.  I saw the patient in April 2022 he had had some issues concerning erectile dysfunction his PCP recommended that he see me concerning sildenafil dosing.  During that visit I educated the patient about the use of sildenafil as well as not using this together with his nitroglycerin.  He expressed understanding.  Today he offers no complaints.  He is enthusiastic about  losing weight.     Past Medical History:  Diagnosis Date   Acute congestive heart failure ([place]) 28/07/8674   Acute diastolic CHF (congestive heart failure) ([place]) 11/21/2019   HTN (hypertension) 11/19/2019   Hypertension    Hypertensive emergency 11/20/2019   Obesity, Class III, BMI 40-49.9 (morbid obesity) ([place]) 11/21/2019   Tinea cruris 11/19/2019    Past Surgical History:  Procedure Laterality Date   NO PAST SURGERIES      Current Medications: Current Meds  Medication Sig   acetaminophen (TYLENOL) 500 MG tablet Take 500 mg by mouth every 6 (six) hours as needed for mild pain.   [DISCONTINUED] amLODipine (NORVASC) 10 MG tablet TAKE 1 TABLET (10 MG TOTAL) BY MOUTH DAILY.   [DISCONTINUED] furosemide (LASIX) 40 MG tablet Take 1 tablet (40 mg total) by mouth 2 (two) times daily.   [DISCONTINUED] losartan (COZAAR) 100 MG tablet Take 50 mg by mouth 2 (two) times daily.   [DISCONTINUED] spironolactone (ALDACTONE) 25 MG tablet TAKE ONE TABLET BY MOUTH DAILY     Allergies:   Penicillins, Hydralazine, Olive oil, Other, and Raspberry   Social History   Socioeconomic History   Marital status: Divorced    Spouse name: Not on file   Number of children: Not on file   Years of education: Not on file   Highest education  level: Not on file  Occupational History   Not on file  Tobacco Use   Smoking status: Never   Smokeless tobacco: Never  Vaping Use   Vaping Use: Never used  Substance and Sexual Activity   Alcohol use: Never   Drug use: Never   Sexual activity: Not on file  Other Topics Concern   Not on file  Social History Narrative   Not on file   Social Determinants of Health   Financial Resource Strain: Not on file  Food Insecurity: Not on file  Transportation Needs: Not on file  Physical Activity: Not on file  Stress: Not on file  Social Connections: Not on file     Family History: The patient's family history includes Breast cancer in his mother; Hypertension in  his father.  ROS:   Review of Systems  Constitution: Negative for decreased appetite, fever and weight gain.  HENT: Negative for congestion, ear discharge, hoarse voice and sore throat.   Eyes: Negative for discharge, redness, vision loss in right eye and visual halos.  Cardiovascular: Negative for chest pain, dyspnea on exertion, leg swelling, orthopnea and palpitations.  Respiratory: Negative for cough, hemoptysis, shortness of breath and snoring.   Endocrine: Negative for heat intolerance and polyphagia.  Hematologic/Lymphatic: Negative for bleeding problem. Does not bruise/bleed easily.  Skin: Negative for flushing, nail changes, rash and suspicious lesions.  Musculoskeletal: Negative for arthritis, joint pain, muscle cramps, myalgias, neck pain and stiffness.  Gastrointestinal: Negative for abdominal pain, bowel incontinence, diarrhea and excessive appetite.  Genitourinary: Negative for decreased libido, genital sores and incomplete emptying.  Neurological: Negative for brief paralysis, focal weakness, headaches and loss of balance.  Psychiatric/Behavioral: Negative for altered mental status, depression and suicidal ideas.  Allergic/Immunologic: Negative for HIV exposure and persistent infections.    EKGs/Labs/Other Studies Reviewed:    The following studies were reviewed today:   EKG: None today  Transthoracic echocardiogramIMPRESSIONS November 2021  1. Left ventricular ejection fraction, by estimation, is 65 to 70%. The left ventricle has hyperdynamic function. The left ventricle has no regional wall motion abnormalities. There is moderate concentric left  ventricular hypertrophy. Left ventricular diastolic parameters are consistent with Grade II diastolic dysfunction  (pseudonormalization). Elevated left atrial pressure.   2. Right ventricular systolic function is normal. The right ventricular size is normal. Tricuspid regurgitation signal is inadequate for assessing PA  pressure.   3. The mitral valve was not well visualized. No evidence of mitral valve regurgitation. No evidence of mitral stenosis.   4. The aortic valve was not well visualized. Aortic valve regurgitation is not visualized. No aortic stenosis is present.    CTA chest done in December 2021 FINDINGS: Cardiovascular: Suboptimal pulmonary artery opacification due to bolus dispersion and body habitus, but satisfactory to the level of the proximal segmental vessels. No evidence of pulmonary embolism. Normal heart size. No pericardial effusion.   Mediastinum/Nodes: Negative for adenopathy or mass. Mediastinal fat hypertrophy.   Lungs/Pleura: Hazy opacity in the subpleural left lower lobe with probable volume loss. The opacities most consistent with atelectasis and partially affects multiple segments. Trace left pleural effusion. No edema or pneumothorax   Upper Abdomen: Negative   Musculoskeletal: Degenerative endplate spurring.   Review of the MIP images confirms the above findings.   IMPRESSION: 1. Suboptimal but satisfactory pulmonary artery opacification to the segmental level. No evidence of pulmonary embolism. 2. Left lower lobe atelectasis with trace pleural fluid    Recent Labs: No results found for requested labs  within last 8760 hours.  Recent Lipid Panel No results found for: CHOL, TRIG, HDL, CHOLHDL, VLDL, LDLCALC, LDLDIRECT  Physical Exam:    VS:  BP (!) 142/74    Pulse 87    Ht _0  (1.854 m)    Wt (!) 381 lb 12.8 oz (173.2 kg)    SpO2 97%    BMI 50.37 kg/m     Wt Readings from Last 3 Encounters:  01/15/21 (!) 381 lb 12.8 oz (173.2 kg)  04/14/20 (!) 364 lb 9.6 oz (165.4 kg)  01/14/20 (!) 351 lb 6.4 oz (159.4 kg)     GEN: Well nourished, well developed in no acute distress HEENT: Normal NECK: No JVD; No carotid bruits LYMPHATICS: No lymphadenopathy CARDIAC: S1S2 noted,RRR, no murmurs, rubs, gallops RESPIRATORY:  Clear to auscultation without rales, wheezing  or rhonchi  ABDOMEN: Soft, non-tender, non-distended, +bowel sounds, no guarding. EXTREMITIES: +1 bilateral lower extremity edema, No cyanosis, no clubbing MUSCULOSKELETAL:  No deformity  SKIN: Warm and dry NEUROLOGIC:  Alert and oriented x 3, non-focal PSYCHIATRIC:  Normal affect, good insight  ASSESSMENT:    1. Primary hypertension   2. Obesity, Class III, BMI 40-49.9 (morbid obesity) ([place])   3. Chronic diastolic heart failure (HCC)    PLAN:     His blood pressure has improved significantly but not to target.  His goal is less than 130/80 mmHg.  We talked about this in great length.  At this time the patient rather would like to look in his diet and cut out the salt and he also is looking forward to actively losing some weight this year.  We will keep his current medical management.  The patient understands the need to lose weight with diet and exercise. We have discussed specific strategies for this.  I will also refer the patient to our medical weight management program.  I think he will benefit greatly from this program.     The patient is in agreement with the above plan. The patient left the office in stable condition.  The patient will follow up in   Medication Adjustments/Labs and Tests Ordered: Current medicines are reviewed at length with the patient today.  Concerns regarding medicines are outlined above.  Orders Placed This Encounter  Procedures   Amb Ref to Medical Weight Management   Meds ordered this encounter  Medications   amLODipine (NORVASC) 10 MG tablet    Sig: TAKE 1 TABLET (10 MG TOTAL) BY MOUTH DAILY.    Dispense:  30 tablet    Refill:  0   furosemide (LASIX) 40 MG tablet    Sig: Take 1 tablet (40 mg total) by mouth 2 (two) times daily.    Dispense:  180 tablet    Refill:  3   losartan (COZAAR) 100 MG tablet    Sig: Take 0.5 tablets (50 mg total) by mouth 2 (two) times daily.    Dispense:  180 tablet    Refill:  3   spironolactone (ALDACTONE) 25  MG tablet    Sig: Take 1 tablet (25 mg total) by mouth daily.    Dispense:  90 tablet    Refill:  3    Patient Instructions  Medication Instructions:  Your physician recommends that you continue on your current medications as directed. Please refer to the Current Medication list given to you today.  *If you need a refill on your cardiac medications before your next appointment, please call your pharmacy*   Lab Work: None If  you have labs (blood work) drawn today and your tests are completely normal, you will receive your results only by: [place] (if you have MyChart) OR A paper copy in the mail If you have any lab test that is abnormal or we need to change your treatment, we will call you to review the results.   Testing/Procedures: None   Follow-Up: At [facility], you and your health needs are our priority.  As part of our continuing mission to provide you with exceptional heart care, we have created designated Provider Care Teams.  These Care Teams include your primary Cardiologist (physician) and Advanced Practice Providers (APPs -  Physician Assistants and Nurse Practitioners) who all work together to provide you with the care you need, when you need it.  We recommend signing up for the patient portal called "MyChart".  Sign up information is provided on this After Visit Summary.  MyChart is used to connect with patients for Virtual Visits (Telemedicine).  Patients are able to view lab/test results, encounter notes, upcoming appointments, etc.  Non-urgent messages can be sent to your provider as well.   To learn more about what you can Shawn Sanders with MyChart, go to [email or web address].    Your next appointment:   6 month(s)  The format for your next appointment:   In Person  Provider:   Berniece Salines, Shawn Sanders     Other Instructions     Adopting a Healthy Lifestyle.  Know what a healthy weight is for you (roughly BMI <25) and aim to maintain this   Aim for 7+  servings of fruits and vegetables daily   65-80+ fluid ounces of water or unsweet tea for healthy kidneys   Limit to max 1 drink of alcohol per day; avoid smoking/tobacco   Limit animal fats in diet for cholesterol and heart health - choose grass fed whenever available   Avoid highly processed foods, and foods high in saturated/trans fats   Aim for low stress - take time to unwind and care for your mental health   Aim for 150 min of moderate intensity exercise weekly for heart health, and weights twice weekly for bone health   Aim for 7-9 hours of sleep daily   When it comes to diets, agreement about the perfect plan isnt easy to find, even among the experts. Experts at the [employer] developed an idea known as the Healthy Eating Plate. Just imagine a plate divided into logical, healthy portions.   The emphasis is on diet quality:   Load up on vegetables and fruits - one-half of your plate: Aim for color and variety, and remember that potatoes dont count.   Go for whole grains - one-quarter of your plate: Whole wheat, barley, wheat berries, quinoa, oats, brown rice, and foods made with them. If you want pasta, go with whole wheat pasta.   Protein power - one-quarter of your plate: Fish, chicken, beans, and nuts are all healthy, versatile protein sources. Limit red meat.   The diet, however, does go beyond the plate, offering a few other suggestions.   Use healthy plant oils, such as olive, canola, soy, corn, sunflower and peanut. Check the labels, and avoid partially hydrogenated oil, which have unhealthy trans fats.   If youre thirsty, drink water. Coffee and tea are good in moderation, but skip sugary drinks and limit milk and dairy products to one or two daily servings.   The type of carbohydrate in the diet is more important  than the amount. Some sources of carbohydrates, such as vegetables, fruits, whole grains, and beans-are healthier than others.    Finally, stay active  Signed, Shawn Salines, Shawn Sanders  01/15/2021 11:23 AM    [place]

## 2021-03-10 ENCOUNTER — Other Ambulatory Visit: Payer: Self-pay | Admitting: Cardiology

## 2021-03-11 MED ORDER — AMLODIPINE BESYLATE 10 MG PO TABS: 10.0000 mg | ORAL_TABLET | Freq: Every day | ORAL | 3 refills | Status: AC

## 2021-03-11 NOTE — Telephone Encounter (Signed)
Spoke to patient .  Amlodipine 10 mg  daily.   90 days refill x3 . Patient verbalized understanding ?

## 2022-04-14 IMAGING — CR DG CHEST 2V
2 series · 2 of 2 positions shown · non-contrast
Comparison: Radiograph 11/18/2019

CLINICAL DATA: Left chest pain, shortness of breath

EXAM:
CHEST - 2 VIEW

[chest pa]
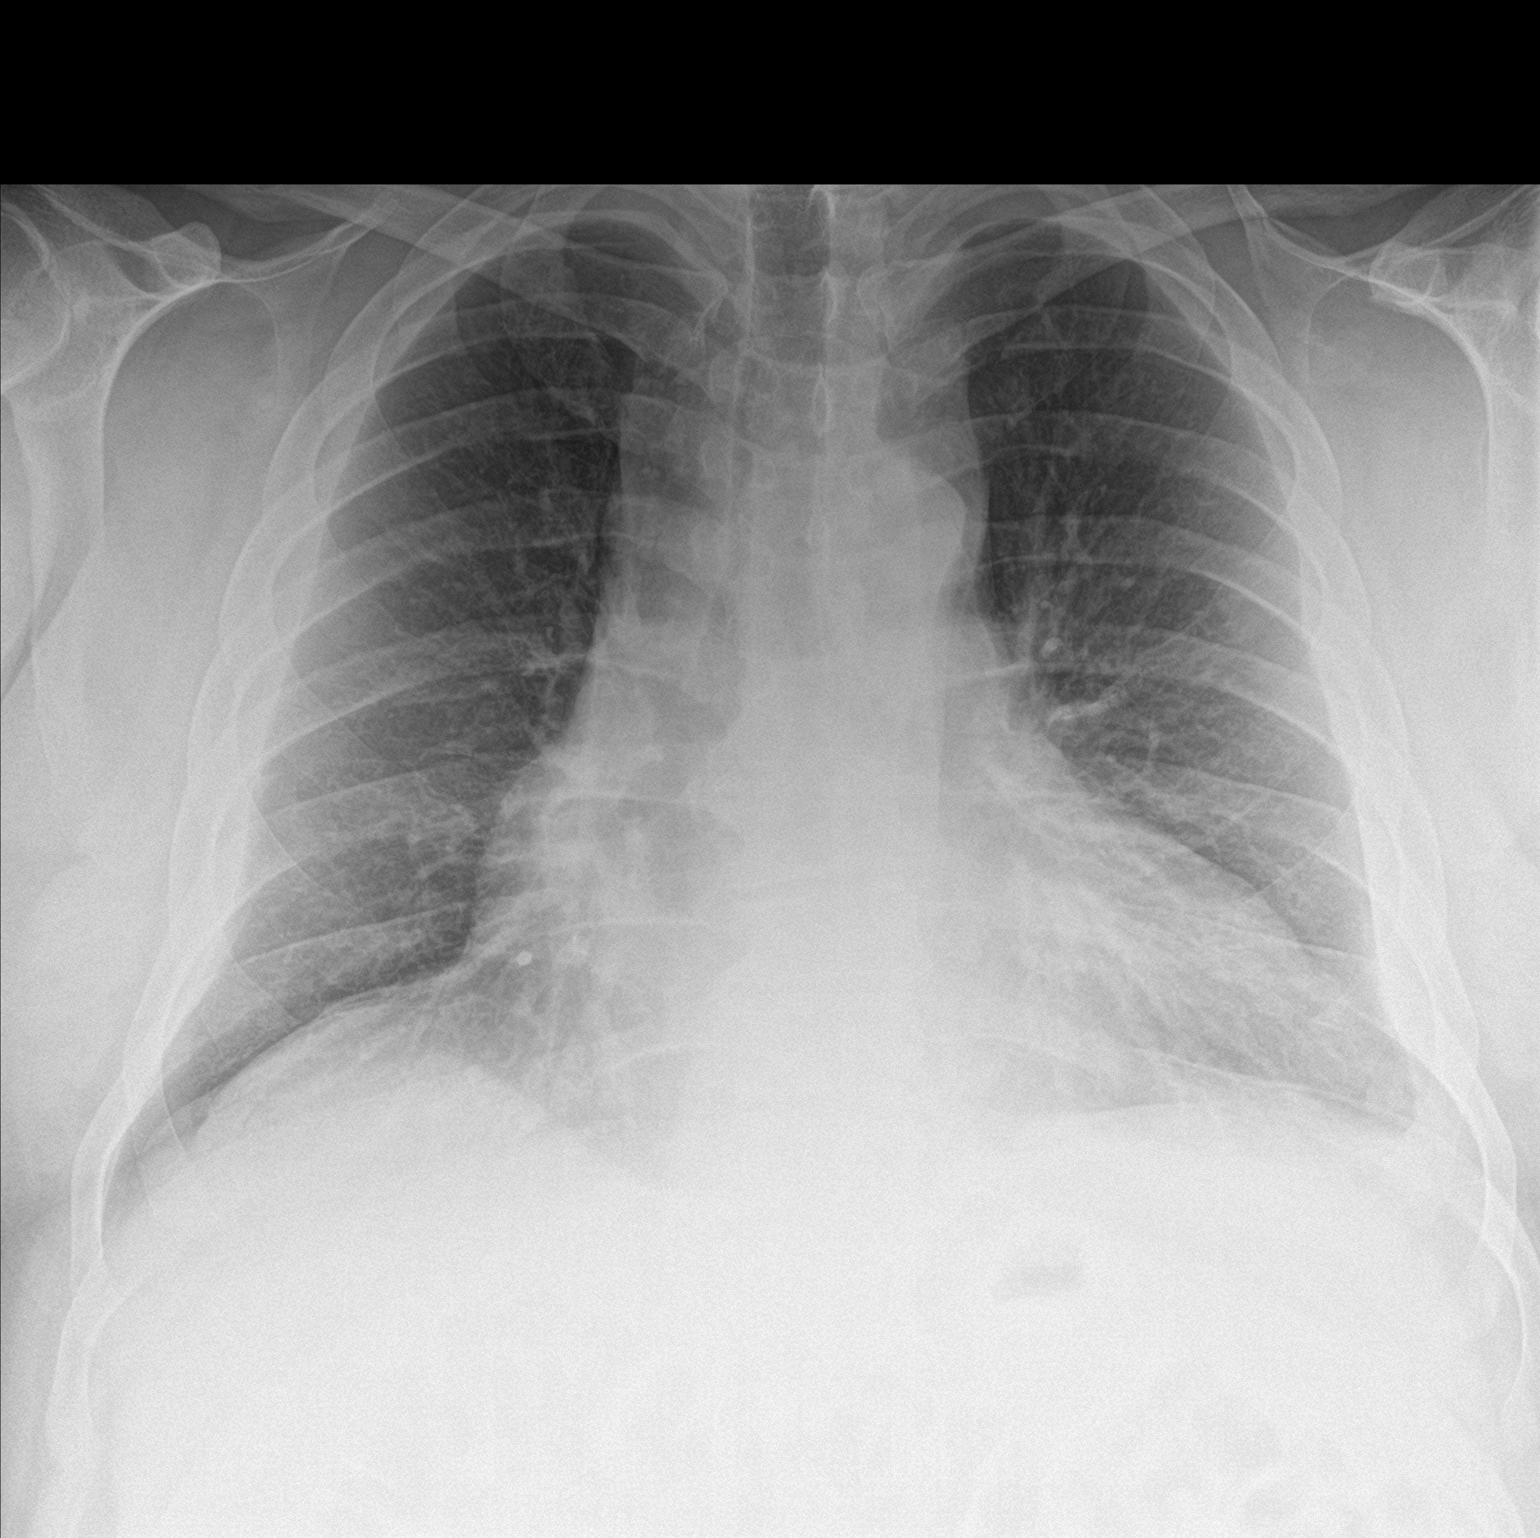

[chest lat]
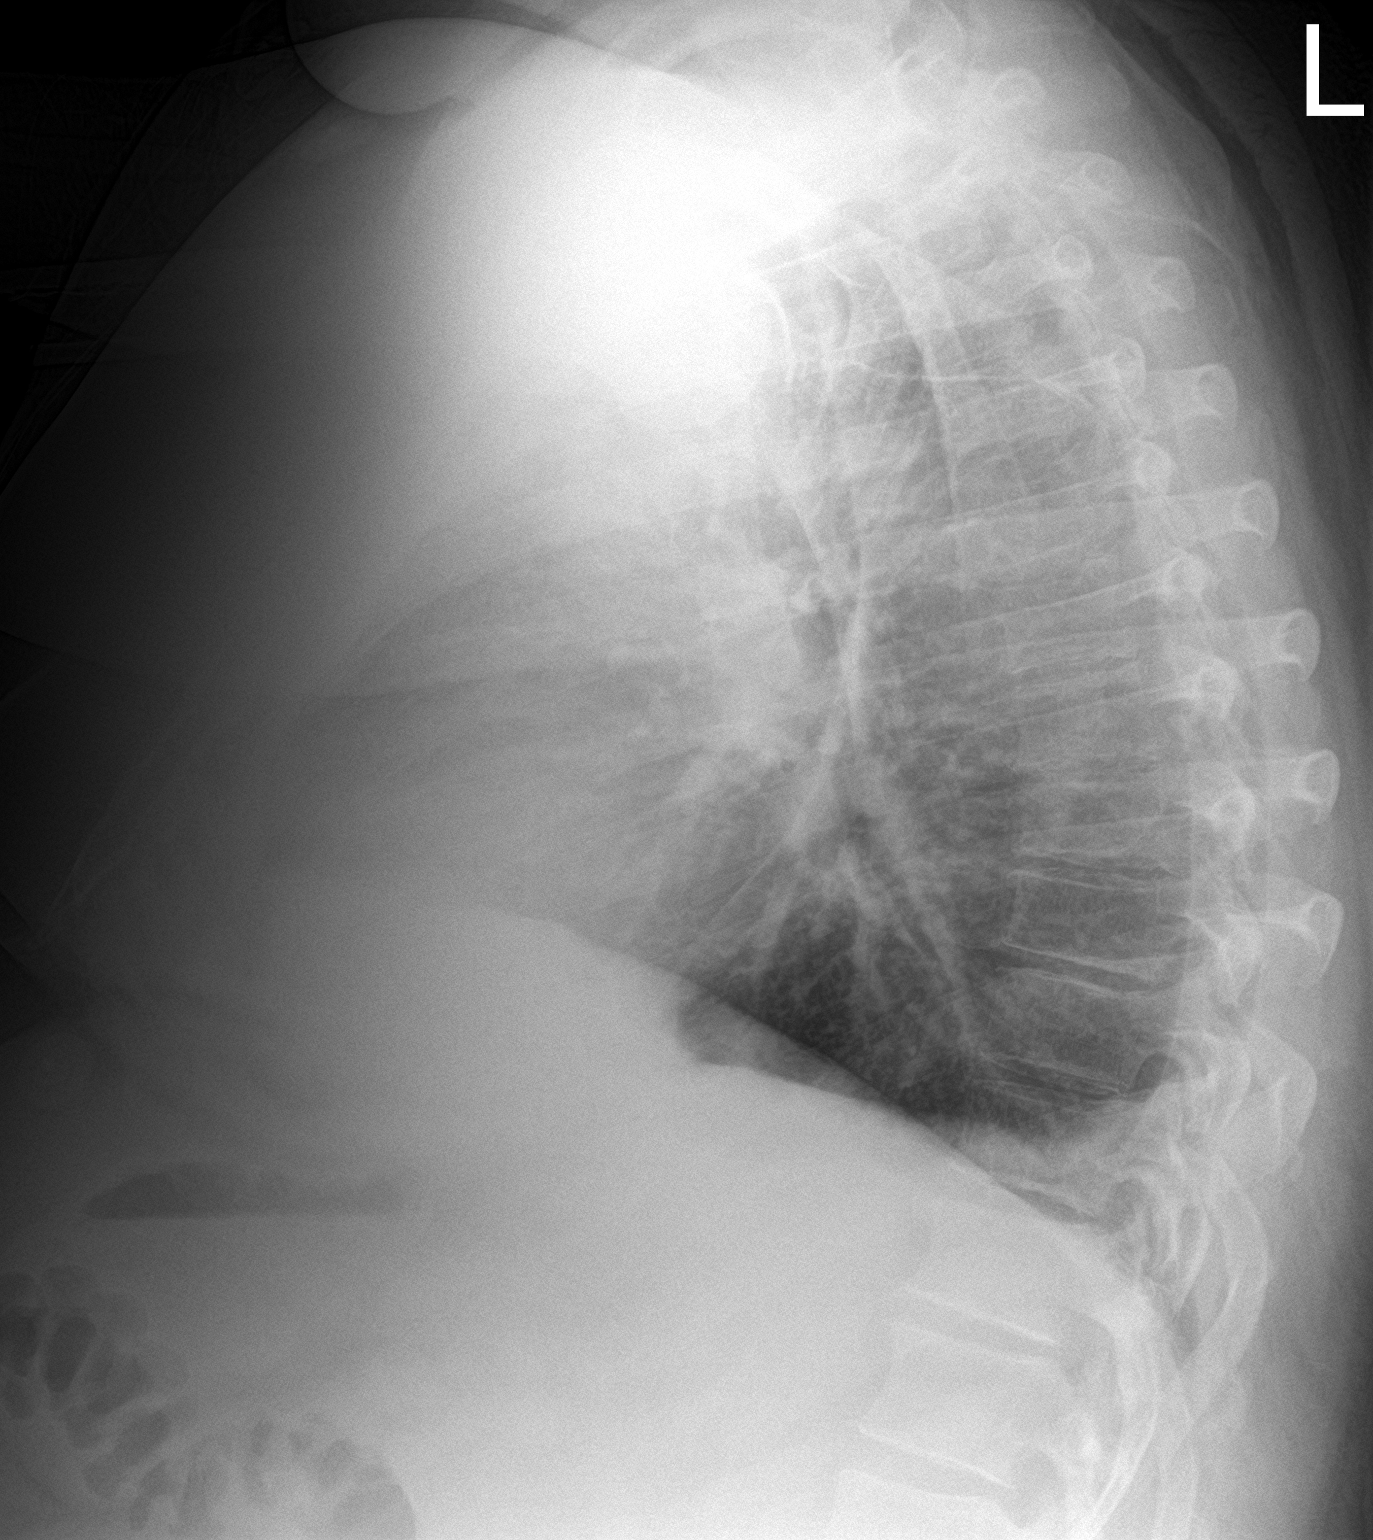

[2 of 2 positions shown; findings below may reference images not displayed]

FINDINGS: Enlarged cardiac silhouette is similar to prior. Mild vascular
congestion, central cuffing and hazy interstitial opacity in the
lungs. Suspect some basilar atelectatic changes as well. Stable
calcified nodule in the right lung base, likely small granuloma. No
consolidative opacity. No pneumothorax. Stable mild pleural
thickening, could reflect some chronic subpleural fat given body
habitus. No clear pneumothorax or effusion. No acute osseous or soft
tissue abnormality. Degenerative changes are present in the imaged
spine and shoulders.
IMPRESSION: Mild vascular congestion, central cuffing and hazy interstitial
opacity in the lungs, suspicious for mild interstitial edema on a
background of atelectatic change.

## 2022-09-24 ENCOUNTER — Telehealth: Payer: Self-pay | Admitting: Cardiology

## 2022-09-24 NOTE — Telephone Encounter (Signed)
Patient is asking that the nurse gives him a call back. States that he his PCP has some concerns for him. Please advise

## 2022-09-24 NOTE — Telephone Encounter (Signed)
Patient called regarding wanting to see Dr Servando Salina for PCP concerns.  Last seen Jan 2023. Advised Dr Servando Salina is booked but can be set with APP.  He states "not what I want but will have to do"  set fr next week and advised to bring any records or test from PCP to be reviewed

## 2022-09-30 ENCOUNTER — Encounter: Payer: Self-pay | Admitting: Physician Assistant

## 2022-09-30 ENCOUNTER — Other Ambulatory Visit (HOSPITAL_COMMUNITY): Payer: Self-pay

## 2022-09-30 ENCOUNTER — Ambulatory Visit: Payer: No Typology Code available for payment source | Attending: Physician Assistant | Admitting: Physician Assistant

## 2022-09-30 ENCOUNTER — Telehealth: Payer: Self-pay | Admitting: Pharmacist Clinician (PhC)/ Clinical Pharmacy Specialist

## 2022-09-30 VITALS — BP 130/70 | HR 84 | Ht 73.0 in | Wt 377.0 lb

## 2022-09-30 DIAGNOSIS — I10 Essential (primary) hypertension: Secondary | ICD-10-CM

## 2022-09-30 DIAGNOSIS — I5032 Chronic diastolic (congestive) heart failure: Secondary | ICD-10-CM

## 2022-09-30 MED ORDER — WEGOVY 0.25 MG/0.5ML ~~LOC~~ SOAJ: 0.2500 mg | SUBCUTANEOUS | Status: AC

## 2022-09-30 MED ORDER — WEGOVY 1 MG/0.5ML ~~LOC~~ SOAJ: 1.0000 mg | SUBCUTANEOUS | 0 refills | Status: AC

## 2022-09-30 MED ORDER — WEGOVY 0.5 MG/0.5ML ~~LOC~~ SOAJ: 0.5000 mg | SUBCUTANEOUS | 0 refills | Status: AC

## 2022-09-30 NOTE — Progress Notes (Addendum)
Cardiology Office Note:  .   Date:  09/30/2022  ID:  Shawn Oman., DOB 07/16/1970, MRN [id number] PCP: Anselmo Pickler, MD  [facility] Providers Cardiologist:  Thomasene Ripple, DO     History of Present Illness: .   Shawn Prevatt. is a 52 y.o. male with PMH of HTN, obesity and chronic diastolic heart failure.  Patient was admitted in 2021 for hypertensive urgency.  At the time he also has significant bilateral lower extremity edema, Lasix was increased.  Echocardiogram obtained in November 2021 showed EF 65 to 70%, moderate LVH, grade 2 DD, normal RV, no significant valve issue.  Antihypertensive medication was titrated.  He was seen again in the ED in December 2021 for chest pain.  CTA was negative for PE.  He was concerned that hydralazine may be causing his symptoms, therefore hydralazine was discontinued.  He did have lightheadedness whenever systolic blood pressure dropped below 130 mmHg.  He was last seen by Dr. Servando Salina on 01/15/2021 at which time his blood pressure remained borderline elevated at 142/74.  Patient presents today for cardiology follow-up.  He is interested to consider the GLP-1 weight loss medications.  I think this is a good alternative to the stimulant that was used in the past for weight loss.  He is aware that there are rare GI dysmotility side effect associated with the medication.  He denies any recent chest pain or shortness of breath.  He has been having trouble losing weight.  His blood pressure is very well-controlled.  I discussed the case with our clinical pharmacist Cleon Gustin who presents to the weight loss clinic as well.  For diabetic patient, her recommendation would be either Mounjaro versus Ozempic, for nondiabetic medication it would be Zepbound (same formula as Mounjaro) versus Wegovy (same formula as Ozempic).  Patient is willing to try that weight loss medication.  Otherwise, he can follow-up in 56-month.  Hopefuly with  significant weight loss, he has blood pressure would be better controlled as well.  ROS:   He denies chest pain, palpitations, dyspnea, pnd, orthopnea, n, v, dizziness, syncope, edema, weight gain, or early satiety. All other systems reviewed and are otherwise negative except as noted above.    Studies Reviewed: Marland Kitchen   EKG Interpretation Date/Time:  Thursday September 30 2022 14:25:00 EDT Ventricular Rate:  80 PR Interval:  166 QRS Duration:  106 QT Interval:  380 QTC Calculation: 438 R Axis:   43  Text Interpretation: Normal sinus rhythm Normal ECG When compared with ECG of 31-Dec-2019 01:50, No significant change was found Confirmed by [name] [phone]) on 09/30/2022 2:55:22 PM    Cardiac Studies & Procedures       ECHOCARDIOGRAM  ECHOCARDIOGRAM COMPLETE 11/20/2019  Narrative ECHOCARDIOGRAM REPORT    Patient Name:   Shawn Sanders Date of Exam: 11/20/2019 Medical Rec #:  [id number]    Height:       73.0 in Accession #:    [id number]   Weight:       355.7 lb Date of Birth:  1970-05-07   BSA:          2.748 m Patient Age:    49 years     BP:           174/108 mmHg Patient Gender: M            HR:           90 bpm. Exam Location:  Inpatient  Procedure: 2D  Echo, Cardiac Doppler, Color Doppler and Intracardiac Opacification Agent  Indications:    R07.9* Chest pain, unspecified; I50.9* Heart failure (unspecified)  History:        Patient has no prior history of Echocardiogram examinations. Signs/Symptoms:Chest Pain; Risk Factors:Hypertension.  Sonographer:    Sheralyn Boatman RDCS Referring Phys: [id number] Cecille Po [facility]   Sonographer Comments: Technically challenging study due to limited acoustic windows, Technically difficult study due to poor echo windows, suboptimal parasternal window, suboptimal apical window, suboptimal subcostal window and patient is morbidly obese. Image acquisition challenging due to patient body habitus. IMPRESSIONS   1. Left ventricular ejection  fraction, by estimation, is 65 to 70%. The left ventricle has hyperdynamic function. The left ventricle has no regional wall motion abnormalities. There is moderate concentric left ventricular hypertrophy. Left ventricular diastolic parameters are consistent with Grade II diastolic dysfunction (pseudonormalization). Elevated left atrial pressure. 2. Right ventricular systolic function is normal. The right ventricular size is normal. Tricuspid regurgitation signal is inadequate for assessing PA pressure. 3. The mitral valve was not well visualized. No evidence of mitral valve regurgitation. No evidence of mitral stenosis. 4. The aortic valve was not well visualized. Aortic valve regurgitation is not visualized. No aortic stenosis is present.  FINDINGS Left Ventricle: Left ventricular ejection fraction, by estimation, is 65 to 70%. The left ventricle has hyperdynamic function. The left ventricle has no regional wall motion abnormalities. Definity contrast agent was given IV to delineate the left ventricular endocardial borders. The left ventricular internal cavity size was normal in size. There is moderate concentric left ventricular hypertrophy. Left ventricular diastolic parameters are consistent with Grade II diastolic dysfunction (pseudonormalization). Elevated left atrial pressure.  Right Ventricle: The right ventricular size is normal. No increase in right ventricular wall thickness. Right ventricular systolic function is normal. Tricuspid regurgitation signal is inadequate for assessing PA pressure.  Left Atrium: Left atrial size was not well visualized.  Right Atrium: Right atrial size was not well visualized.  Pericardium: There is no evidence of pericardial effusion.  Mitral Valve: The mitral valve was not well visualized. No evidence of mitral valve regurgitation. No evidence of mitral valve stenosis.  Tricuspid Valve: The tricuspid valve is not well visualized. Tricuspid valve  regurgitation is not demonstrated.  Aortic Valve: Borderline high aortic valve gradients are related to high cardiac output. The aortic valve was not well visualized. Aortic valve regurgitation is not visualized. No aortic stenosis is present. Aortic valve mean gradient measures 10.0 mmHg. Aortic valve peak gradient measures 18.0 mmHg. Aortic valve area, by VTI measures 2.48 cm.  Pulmonic Valve: The pulmonic valve was not well visualized. Pulmonic valve regurgitation is not visualized.  Aorta: The aortic root is normal in size and structure.  Venous: The inferior vena cava was not well visualized.  IAS/Shunts: The interatrial septum was not well visualized.   LEFT VENTRICLE PLAX 2D LVIDd:         4.30 cm      Diastology LVIDs:         2.90 cm      LV e' medial:    6.31 cm/s LV PW:         1.70 cm      LV E/e' medial:  21.1 LV IVS:        1.50 cm      LV e' lateral:   6.09 cm/s LVOT diam:     2.00 cm      LV E/e' lateral: 21.8 LV SV:  91 LV SV Index:   33 LVOT Area:     3.14 cm  LV Volumes (MOD) LV vol d, MOD A2C: 117.0 ml LV vol d, MOD A4C: 124.0 ml LV vol s, MOD A2C: 34.1 ml LV vol s, MOD A4C: 53.3 ml LV SV MOD A2C:     82.9 ml LV SV MOD A4C:     124.0 ml LV SV MOD BP:      83.8 ml  RIGHT VENTRICLE RV S prime:     15.90 cm/s TAPSE (M-mode): 3.2 cm  LEFT ATRIUM             Index       RIGHT ATRIUM           Index LA diam:        3.50 cm 1.27 cm/m  RA Area:     19.00 cm LA Vol (A2C):   25.4 ml 9.24 ml/m  RA Volume:   50.80 ml  18.49 ml/m LA Vol (A4C):   38.7 ml 14.08 ml/m LA Biplane Vol: 33.3 ml 12.12 ml/m AORTIC VALVE AV Area (Vmax):    2.34 cm AV Area (Vmean):   2.07 cm AV Area (VTI):     2.48 cm AV Vmax:           212.00 cm/s AV Vmean:          150.000 cm/s AV VTI:            0.366 m AV Peak Grad:      18.0 mmHg AV Mean Grad:      10.0 mmHg LVOT Vmax:         158.00 cm/s LVOT Vmean:        98.900 cm/s LVOT VTI:          0.289 m LVOT/AV VTI  ratio: 0.79  AORTA Ao Root diam: 3.00 cm  MITRAL VALVE MV Area (PHT): 3.12 cm     SHUNTS MV Decel Time: 243 msec     Systemic VTI:  0.29 m MV E velocity: 133.00 cm/s  Systemic Diam: 2.00 cm MV A velocity: 99.10 cm/s MV E/A ratio:  1.34  Mihai Croitoru MD Electronically signed by Thurmon Fair MD Signature Date/Time: 11/20/2019/10:25:00 AM    Final             Risk Assessment/Calculations:             Physical Exam:   VS:  BP 130/70 (BP Location: Left Arm, Patient Position: Sitting)   Pulse 84   Ht 6\' 1"  (1.854 m)   Wt (!) 377 lb (171 kg)   BMI 49.74 kg/m    Wt Readings from Last 3 Encounters:  09/30/22 (!) 377 lb (171 kg)  01/15/21 (!) 381 lb 12.8 oz (173.2 kg)  04/14/20 (!) 364 lb 9.6 oz (165.4 kg)    GEN: Well nourished, well developed in no acute distress NECK: No JVD; No carotid bruits CARDIAC: RRR, no murmurs, rubs, gallops RESPIRATORY:  Clear to auscultation without rales, wheezing or rhonchi  ABDOMEN: Soft, non-tender, non-distended EXTREMITIES:  No edema; No deformity Brownish venous discoloration in both lower extremity  ASSESSMENT AND PLAN: .    Chronic diastolic heart failure: Blood pressure well-controlled.  Euvolemic on current therapy.  Hypertension: Blood pressure well-controlled on amlodipine, losartan spironolactone.  Morbid obesity: Current weight 377 pounds, BMI 49.75.  He is interested in consider weight loss medication.  Given his weight, I would avoid stimulant.  The new GLP-1 class of weight  loss medication would be a good choice.  The class of medication also has cardiovascular benefit as well.  Wegovy or Zepbound would both be favored in this patient.  He is aware that there is rare incidence of GI dysmotility issue associated with medication.  Our clinical pharmacist was discussed with the patient to start on the weight loss medication based on his insurance coverage       Dispo: Follow-up in 6 months with Dr. Servando Salina  Signed, [name],  [place]

## 2022-09-30 NOTE — Telephone Encounter (Signed)
Patient was in office to see [name] today.  Was asking about GLP1 for weight management.    Ran test claim, but meds not covered on his plan and he has no history of MI, stroke or PAD.  Patient willing to pay cash, aware that retail price is $650/month.   Reviewed all information about lifestyle modifications, importance of continued exercise, portion control, side effects, dosing and injection technique.  Gave sample of 0.25 mg dose and will send 0.5 and 1 mg doses to his pharmacy.  Once he gets started on med, will plan to see him in 3 months for follow up.

## 2022-09-30 NOTE — Patient Instructions (Addendum)
Medication Instructions:  START WEGOVY 0.25 MG ONCE WEEKLY FOR 4 WEEKS INCREASE TO 0.5 MG WEEKLY AFTER THAT FOR 4 MORE WEEKS, THEN UP TO 1 MG WEEKLY  *If you need a refill on your cardiac medications before your next appointment, please call your pharmacy*   Lab Work: NO LABS If you have labs (blood work) drawn today and your tests are completely normal, you will receive your results only by: MyChart Message (if you have MyChart) OR A paper copy in the mail If you have any lab test that is abnormal or we need to change your treatment, we will call you to review the results.   Testing/Procedures: NO TESTING   Follow-Up: At [facility], you and your health needs are our priority.  As part of our continuing mission to provide you with exceptional heart care, we have created designated Provider Care Teams.  These Care Teams include your primary Cardiologist (physician) and Advanced Practice Providers (APPs -  Physician Assistants and Nurse Practitioners) who all work together to provide you with the care you need, when you need it.    Your next appointment:   6 month(s)  Provider:   Thomasene Ripple, DO
